# Patient Record
Sex: Male | Born: 1935 | Race: Asian | Hispanic: No | Marital: Married | Smoking: Never smoker
Health system: Southern US, Community
[De-identification: ages and names within clinical notes are randomized; demographics above are authoritative.]

## PROBLEM LIST (undated history)

## (undated) DIAGNOSIS — E785 Hyperlipidemia, unspecified: Secondary | ICD-10-CM

## (undated) DIAGNOSIS — E119 Type 2 diabetes mellitus without complications: Secondary | ICD-10-CM

## (undated) DIAGNOSIS — I10 Essential (primary) hypertension: Secondary | ICD-10-CM

## (undated) HISTORY — DX: Essential (primary) hypertension: I10

## (undated) HISTORY — DX: Type 2 diabetes mellitus without complications: E11.9

## (undated) HISTORY — DX: Hyperlipidemia, unspecified: E78.5

---

## 2001-05-27 ENCOUNTER — Ambulatory Visit (HOSPITAL_COMMUNITY): Admission: RE | Admit: 2001-05-27 | Discharge: 2001-05-27 | Payer: Self-pay | Admitting: Gastroenterology

## 2006-03-29 ENCOUNTER — Emergency Department (HOSPITAL_COMMUNITY): Admission: EM | Admit: 2006-03-29 | Discharge: 2006-03-29 | Payer: Self-pay | Admitting: Emergency Medicine

## 2006-03-30 ENCOUNTER — Observation Stay (HOSPITAL_COMMUNITY): Admission: AD | Admit: 2006-03-30 | Discharge: 2006-03-31 | Payer: Self-pay | Admitting: Otolaryngology

## 2011-06-12 ENCOUNTER — Other Ambulatory Visit: Payer: Self-pay | Admitting: Gastroenterology

## 2011-06-17 ENCOUNTER — Other Ambulatory Visit: Payer: Self-pay | Admitting: Geriatric Medicine

## 2011-06-17 DIAGNOSIS — R1013 Epigastric pain: Secondary | ICD-10-CM

## 2011-06-18 ENCOUNTER — Ambulatory Visit
Admission: RE | Admit: 2011-06-18 | Discharge: 2011-06-18 | Disposition: A | Discharge status: Home / Self Care | Payer: 59 | Source: Ambulatory Visit | Attending: Geriatric Medicine | Admitting: Geriatric Medicine

## 2011-06-18 DIAGNOSIS — R1013 Epigastric pain: Secondary | ICD-10-CM

## 2014-06-15 ENCOUNTER — Encounter: Payer: Self-pay | Admitting: *Deleted

## 2014-06-15 ENCOUNTER — Encounter: Payer: PPO | Attending: Geriatric Medicine | Admitting: *Deleted

## 2014-06-15 VITALS — Ht 67.0 in | Wt 140.9 lb

## 2014-06-15 DIAGNOSIS — E119 Type 2 diabetes mellitus without complications: Secondary | ICD-10-CM | POA: Insufficient documentation

## 2014-06-15 NOTE — Patient Instructions (Signed)
Plan:  Aim for 3 Carb Choices per meal (45 grams) +/- 1 either way  Aim for 0-2 Carbs per snack if hungry  Include protein in moderation with your meals and snacks Consider reading food labels for Total Carbohydrate of foods Continue with your activity level daily as tolerated Consider checking BG at alternate times per day twice a week

## 2014-06-15 NOTE — Progress Notes (Signed)
  Medical Nutrition Therapy:  Appt start time: 0730 end time:  0900.  Assessment:  Primary concerns today: 06/15/14. Here with his wife who is very supportive and appears to speak better [place]. They shop together and she cooks the food. 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 He does not have a meter yet. At 5 AM [facility], then they do stretches 20 minutes and other exercises for total of an hour 6 days a week. He also gardens Spring through Fall and mows his own lawn and rakes the leaves.   Preferred Learning Style:   Auditory  Visual  Hands on  Learning Readiness:   Ready  Change in progress  MEDICATIONS: see list   DIETARY INTAKE:  24-hr recall:  B ( AM):  1/2 of a fresh fruit, 1/2 sweet potato, 1 toast, egg, homemade yogurt and fresh homemade jam, occasionally sausage,  Snk ( AM): no  L ( PM): lean meat, rice, vegetables (from their garden if available), 1 prune, dark chocolate after the meal Snk ( PM): no D ( PM):  leftovers from lunch, lighter meal:  lean meat leftover rice from lunch Snk ( PM): small bowl of mixed nuts Beverages: homemade soybean milk, lemon water, green tea  Usual physical activity:  [employer] swim[employer] 45 minutes, then stretches 20 minutes and other exercises for total of an hour 6 days a week. Also gardens Spring through Fall. He also mows his own lawn and rakes the leaves.    Estimated energy needs: 1600 calories 180 g carbohydrates 120 g protein 44 g fat  Progress Towards Goal(s):  In progress.   Nutritional Diagnosis:  NB-1.1 Food and nutrition-related knowledge deficit As related to diabetes.  As evidenced by A1c of 7.1.    Intervention:  Nutrition counseling and diabetes education initiated. Discussed Carb Counting by food group as method of portion control, reading food labels, and benefits of continued activity. Discussed  physiology of DM 2, rationale of SMBG and suggested he test once a day at alternate times of day for the first week. If all BG within target ranges, consider testing twice a week.   Teaching Method Utilized: Visual, Auditory and Hands on  Handouts given during visit include: Living Well with Diabetes Carb Counting and Food Label handouts Meal Plan Card  Barriers to learning/adherence to lifestyle change: none  Demonstrated degree of understanding via:  Teach Back   Monitoring/Evaluation:  Dietary intake, exercise, reading food labels, and body weight prn.

## 2015-07-04 DIAGNOSIS — Z1389 Encounter for screening for other disorder: Secondary | ICD-10-CM | POA: Diagnosis not present

## 2015-07-04 DIAGNOSIS — E119 Type 2 diabetes mellitus without complications: Secondary | ICD-10-CM | POA: Diagnosis not present

## 2015-07-04 DIAGNOSIS — Z0000 Encounter for general adult medical examination without abnormal findings: Secondary | ICD-10-CM | POA: Diagnosis not present

## 2015-07-04 DIAGNOSIS — I10 Essential (primary) hypertension: Secondary | ICD-10-CM | POA: Diagnosis not present

## 2015-07-04 DIAGNOSIS — E7800 Pure hypercholesterolemia, unspecified: Secondary | ICD-10-CM | POA: Diagnosis not present

## 2015-07-04 DIAGNOSIS — Z79899 Other long term (current) drug therapy: Secondary | ICD-10-CM | POA: Diagnosis not present

## 2015-07-04 DIAGNOSIS — Z7984 Long term (current) use of oral hypoglycemic drugs: Secondary | ICD-10-CM | POA: Diagnosis not present

## 2015-07-29 ENCOUNTER — Encounter: Payer: Self-pay | Admitting: Podiatry

## 2015-07-29 ENCOUNTER — Ambulatory Visit (INDEPENDENT_AMBULATORY_CARE_PROVIDER_SITE_OTHER): Payer: PPO | Admitting: Podiatry

## 2015-07-29 ENCOUNTER — Ambulatory Visit (INDEPENDENT_AMBULATORY_CARE_PROVIDER_SITE_OTHER): Payer: PPO

## 2015-07-29 VITALS — BP 114/67 | HR 75 | Resp 16 | Ht 67.5 in | Wt 140.0 lb

## 2015-07-29 DIAGNOSIS — M722 Plantar fascial fibromatosis: Secondary | ICD-10-CM

## 2015-07-29 DIAGNOSIS — M7731 Calcaneal spur, right foot: Secondary | ICD-10-CM

## 2015-07-29 MED ORDER — TRIAMCINOLONE ACETONIDE 10 MG/ML IJ SUSP
10.0000 mg | Freq: Once | INTRAMUSCULAR | Status: AC
  Administered 2015-07-29: 10 mg

## 2015-07-29 NOTE — Progress Notes (Signed)
   Subjective:    Patient ID: Timothy Reilly, male    DOB: 1932/11/01, 82 y.o.   MRN: [id number]  HPI Patient presents with foot pain in their right foot; heel. Pt stated, "hurts more in the morning"; Since June 2016.   Review of Systems  All other systems reviewed and are negative.      Objective:   Physical Exam        Assessment & Plan:

## 2015-07-29 NOTE — Patient Instructions (Signed)

## 2015-07-30 NOTE — Progress Notes (Signed)
Subjective:     Patient ID: Timothy Reilly, male   DOB: Feb 07, 1933, 82 y.o.   MRN: [id number]  HPI patient presents stating I have had a lot of pain in my right heel for about 8 months and I did have a case of this several years ago   Review of Systems  All other systems reviewed and are negative.      Objective:   Physical Exam  Constitutional: He is oriented to person, place, and time.  Cardiovascular: Intact distal pulses.   Musculoskeletal: Normal range of motion.  Neurological: He is oriented to person, place, and time.  Skin: Skin is warm.  Nursing note and vitals reviewed.  neurovascular status intact muscle strength adequate range of motion within normal limits with patient found to have severe discomfort right plantar heel at the insertional point tendon calcaneus with fluid buildup around the medial band. Patient has good digital perfusion is well oriented with mild depression of the arch     Assessment:     Acute plantar fasciitis right    Plan:     H&P condition reviewed with patient and x-rays reviewed. Today I injected the plantar fascia 3 mg Kenalog 5 mill grams Xylocaine and applied fascial brace with instructions on the usage along with physical therapy and instructed on these activities. Reappoint to recheck  X-ray report indicate small spur with no indications of stress fracture arthritis

## 2015-08-09 DIAGNOSIS — E119 Type 2 diabetes mellitus without complications: Secondary | ICD-10-CM | POA: Diagnosis not present

## 2015-08-09 DIAGNOSIS — H2513 Age-related nuclear cataract, bilateral: Secondary | ICD-10-CM | POA: Diagnosis not present

## 2015-08-12 ENCOUNTER — Encounter: Payer: Self-pay | Admitting: Podiatry

## 2015-08-12 ENCOUNTER — Ambulatory Visit (INDEPENDENT_AMBULATORY_CARE_PROVIDER_SITE_OTHER): Payer: PPO | Admitting: Podiatry

## 2015-08-12 VITALS — BP 115/64 | HR 77 | Resp 16

## 2015-08-12 DIAGNOSIS — M722 Plantar fascial fibromatosis: Secondary | ICD-10-CM | POA: Diagnosis not present

## 2015-08-12 NOTE — Progress Notes (Signed)
Subjective:     Patient ID: Timothy Reilly, male   DOB: 17-Nov-1932, 82 y.o.   MRN: [id number]  HPI patient states the heel is feeling a lot better   Review of Systems     Objective:   Physical Exam Neurovascular status intact muscle strength adequate with diminished discomfort plantar aspect right heel at the insertional point tendon into the calcaneus    Assessment:     Improving plantar fasciitis right with pain still upon deep palpation    Plan:     Advised on physical therapy anti-inflammatories and supportive shoe gear usage. Reappoint as needed

## 2015-10-02 DIAGNOSIS — E119 Type 2 diabetes mellitus without complications: Secondary | ICD-10-CM | POA: Diagnosis not present

## 2015-10-02 DIAGNOSIS — Z7984 Long term (current) use of oral hypoglycemic drugs: Secondary | ICD-10-CM | POA: Diagnosis not present

## 2016-01-06 DIAGNOSIS — E7800 Pure hypercholesterolemia, unspecified: Secondary | ICD-10-CM | POA: Diagnosis not present

## 2016-01-06 DIAGNOSIS — Z79899 Other long term (current) drug therapy: Secondary | ICD-10-CM | POA: Diagnosis not present

## 2016-01-06 DIAGNOSIS — I10 Essential (primary) hypertension: Secondary | ICD-10-CM | POA: Diagnosis not present

## 2016-01-06 DIAGNOSIS — Z7984 Long term (current) use of oral hypoglycemic drugs: Secondary | ICD-10-CM | POA: Diagnosis not present

## 2016-01-06 DIAGNOSIS — E119 Type 2 diabetes mellitus without complications: Secondary | ICD-10-CM | POA: Diagnosis not present

## 2016-07-16 DIAGNOSIS — E7800 Pure hypercholesterolemia, unspecified: Secondary | ICD-10-CM | POA: Diagnosis not present

## 2016-07-16 DIAGNOSIS — N400 Enlarged prostate without lower urinary tract symptoms: Secondary | ICD-10-CM | POA: Diagnosis not present

## 2016-07-16 DIAGNOSIS — Z0000 Encounter for general adult medical examination without abnormal findings: Secondary | ICD-10-CM | POA: Diagnosis not present

## 2016-07-16 DIAGNOSIS — Z7984 Long term (current) use of oral hypoglycemic drugs: Secondary | ICD-10-CM | POA: Diagnosis not present

## 2016-07-16 DIAGNOSIS — E119 Type 2 diabetes mellitus without complications: Secondary | ICD-10-CM | POA: Diagnosis not present

## 2016-07-16 DIAGNOSIS — I10 Essential (primary) hypertension: Secondary | ICD-10-CM | POA: Diagnosis not present

## 2016-07-16 DIAGNOSIS — Z79899 Other long term (current) drug therapy: Secondary | ICD-10-CM | POA: Diagnosis not present

## 2016-07-16 DIAGNOSIS — Z23 Encounter for immunization: Secondary | ICD-10-CM | POA: Diagnosis not present

## 2016-07-16 DIAGNOSIS — Z1389 Encounter for screening for other disorder: Secondary | ICD-10-CM | POA: Diagnosis not present

## 2017-01-11 ENCOUNTER — Other Ambulatory Visit: Payer: Self-pay | Admitting: Geriatric Medicine

## 2017-01-11 DIAGNOSIS — Z79899 Other long term (current) drug therapy: Secondary | ICD-10-CM | POA: Diagnosis not present

## 2017-01-11 DIAGNOSIS — R1011 Right upper quadrant pain: Secondary | ICD-10-CM | POA: Diagnosis not present

## 2017-01-11 DIAGNOSIS — I10 Essential (primary) hypertension: Secondary | ICD-10-CM | POA: Diagnosis not present

## 2017-01-11 DIAGNOSIS — E119 Type 2 diabetes mellitus without complications: Secondary | ICD-10-CM | POA: Diagnosis not present

## 2017-01-11 DIAGNOSIS — E7800 Pure hypercholesterolemia, unspecified: Secondary | ICD-10-CM | POA: Diagnosis not present

## 2017-01-15 ENCOUNTER — Ambulatory Visit
Admission: RE | Admit: 2017-01-15 | Discharge: 2017-01-15 | Disposition: A | Discharge status: Home / Self Care | Payer: PPO | Source: Ambulatory Visit | Attending: Geriatric Medicine | Admitting: Geriatric Medicine

## 2017-01-15 DIAGNOSIS — R1011 Right upper quadrant pain: Secondary | ICD-10-CM | POA: Diagnosis not present

## 2017-01-15 MED ORDER — IOPAMIDOL (ISOVUE-300) INJECTION 61%
100.0000 mL | Freq: Once | INTRAVENOUS | Status: AC | PRN
  Administered 2017-01-15: 100 mL via INTRAVENOUS

## 2017-03-05 ENCOUNTER — Emergency Department (HOSPITAL_COMMUNITY)
Admission: EM | Admit: 2017-03-05 | Discharge: 2017-03-05 | Disposition: A | Discharge status: Home / Self Care | Payer: PPO | Attending: Emergency Medicine | Admitting: Emergency Medicine

## 2017-03-05 ENCOUNTER — Encounter (HOSPITAL_COMMUNITY): Payer: Self-pay | Admitting: Emergency Medicine

## 2017-03-05 DIAGNOSIS — E119 Type 2 diabetes mellitus without complications: Secondary | ICD-10-CM | POA: Diagnosis not present

## 2017-03-05 DIAGNOSIS — I6789 Other cerebrovascular disease: Secondary | ICD-10-CM | POA: Diagnosis not present

## 2017-03-05 DIAGNOSIS — G510 Bell's palsy: Secondary | ICD-10-CM | POA: Insufficient documentation

## 2017-03-05 DIAGNOSIS — Z7984 Long term (current) use of oral hypoglycemic drugs: Secondary | ICD-10-CM | POA: Insufficient documentation

## 2017-03-05 DIAGNOSIS — Z7982 Long term (current) use of aspirin: Secondary | ICD-10-CM | POA: Insufficient documentation

## 2017-03-05 DIAGNOSIS — I10 Essential (primary) hypertension: Secondary | ICD-10-CM | POA: Insufficient documentation

## 2017-03-05 DIAGNOSIS — I69992 Facial weakness following unspecified cerebrovascular disease: Secondary | ICD-10-CM | POA: Diagnosis not present

## 2017-03-05 DIAGNOSIS — Z79899 Other long term (current) drug therapy: Secondary | ICD-10-CM | POA: Insufficient documentation

## 2017-03-05 DIAGNOSIS — R29810 Facial weakness: Secondary | ICD-10-CM | POA: Diagnosis present

## 2017-03-05 LAB — CBC
HCT: 42 % (ref 39.0–52.0)
Hemoglobin: 14.1 g/dL (ref 13.0–17.0)
MCH: 30.7 pg (ref 26.0–34.0)
MCHC: 33.6 g/dL (ref 30.0–36.0)
MCV: 91.5 fL (ref 78.0–100.0)
PLATELETS: 230 10*3/uL (ref 150–400)
RBC: 4.59 MIL/uL (ref 4.22–5.81)
RDW: 13.2 % (ref 11.5–15.5)
WBC: 8.2 10*3/uL (ref 4.0–10.5)

## 2017-03-05 LAB — COMPREHENSIVE METABOLIC PANEL
ALT: 16 U/L — AB (ref 17–63)
AST: 24 U/L (ref 15–41)
Albumin: 3.6 g/dL (ref 3.5–5.0)
Alkaline Phosphatase: 58 U/L (ref 38–126)
Anion gap: 8 (ref 5–15)
BUN: 18 mg/dL (ref 6–20)
CHLORIDE: 102 mmol/L (ref 101–111)
CO2: 27 mmol/L (ref 22–32)
CREATININE: 0.88 mg/dL (ref 0.61–1.24)
Calcium: 9.2 mg/dL (ref 8.9–10.3)
GFR calc non Af Amer: >60 mL/min (ref >60)
Glucose, Bld: 149 mg/dL — ABNORMAL HIGH (ref 65–99)
Potassium: 3.3 mmol/L — ABNORMAL LOW (ref 3.5–5.1)
SODIUM: 137 mmol/L (ref 135–145)
Total Bilirubin: 0.8 mg/dL (ref 0.3–1.2)
Total Protein: 7.3 g/dL (ref 6.5–8.1)

## 2017-03-05 LAB — I-STAT CHEM 8, ED
BUN: 19 mg/dL (ref 6–20)
CHLORIDE: 100 mmol/L — AB (ref 101–111)
Calcium, Ion: 1.1 mmol/L — ABNORMAL LOW (ref 1.15–1.40)
Creatinine, Ser: 0.8 mg/dL (ref 0.61–1.24)
GLUCOSE: 149 mg/dL — AB (ref 65–99)
HEMATOCRIT: 43 % (ref 39.0–52.0)
Hemoglobin: 14.6 g/dL (ref 13.0–17.0)
POTASSIUM: 3.2 mmol/L — AB (ref 3.5–5.1)
Sodium: 138 mmol/L (ref 135–145)
TCO2: 26 mmol/L (ref 22–32)

## 2017-03-05 LAB — DIFFERENTIAL
BASOS ABS: 0 10*3/uL (ref 0.0–0.1)
BASOS PCT: 0 %
Eosinophils Absolute: 0.1 10*3/uL (ref 0.0–0.7)
Eosinophils Relative: 2 %
Lymphocytes Relative: 22 %
Lymphs Abs: 1.8 10*3/uL (ref 0.7–4.0)
MONO ABS: 0.4 10*3/uL (ref 0.1–1.0)
Monocytes Relative: 5 %
NEUTROS ABS: 5.8 10*3/uL (ref 1.7–7.7)
Neutrophils Relative %: 71 %

## 2017-03-05 LAB — I-STAT TROPONIN, ED: Troponin i, poc: 0 ng/mL (ref 0.00–0.08)

## 2017-03-05 LAB — APTT: APTT: 31 s (ref 24–36)

## 2017-03-05 LAB — PROTIME-INR
INR: 0.96
PROTHROMBIN TIME: 12.7 s (ref 11.4–15.2)

## 2017-03-05 MED ORDER — PREDNISONE 20 MG PO TABS
60.0000 mg | ORAL_TABLET | Freq: Once | ORAL | Status: AC
  Administered 2017-03-05: 60 mg via ORAL
  Filled 2017-03-05: qty 3

## 2017-03-05 MED ORDER — PREDNISONE 20 MG PO TABS: 60.0000 mg | ORAL_TABLET | Freq: Every day | ORAL | 0 refills | Status: DC

## 2017-03-05 NOTE — Discharge Instructions (Signed)
Please take Prednisone  (3 pills) once a day for the next week Your potassium levels were slightly low today - please eat foods with extra potassium (banana, leafy greens, vegetables, fruits, meats) Please follow up with your doctor

## 2017-03-05 NOTE — ED Notes (Signed)
Spoke with EDP advised to give PO medication although patient failed swallow screen.

## 2017-03-05 NOTE — ED Triage Notes (Signed)
Patient presents to the Ed from home with complaints of facial droop and difficulty swallowing since wednesday. Patient reports every time he drinks water come out the side of mouth. Patient denies any headache. Patient alert alerted x4 on arrival. Patient denies any pain arrival.

## 2017-03-05 NOTE — ED Notes (Signed)
IV removed, Pt getting dressed and awaiting discharge paperwork.

## 2017-03-05 NOTE — ED Provider Notes (Signed)
[facility]-EMERGENCY DEPT Provider Note   CSN: [id number] Arrival date & time: 03/05/17  1044     History   Chief Complaint Chief Complaint  Patient presents with  . Facial Droop    HPI Timothy Reilly is a 83 y.o. male who presents with right sided facial droop. PMH significant for Type 2 DM, HTN, HLD. He states that he went to the gym yesterday in the morning and went swimming and did facial exercises. He drank some water but noticed he couldn't hold it in his mouth. His wife noticed some difference in his face last night but thought that he may have pulled a facial muscle from doing the face exercises. He reports a mild cold recently and has had increased stress due to having to help take care of his grandchildren for the past couple weeks. No headache, vision changes, dizziness, numbness/tingling, extremity weakness, difficulty walking.  HPI  Past Medical History:  Diagnosis Date  . Diabetes mellitus without complication (HCC)   . Hyperlipidemia   . Hypertension     There are no active problems to display for this patient.   History reviewed. No pertinent surgical history.     Home Medications    Prior to Admission medications   Medication Sig Start Date End Date Taking? Authorizing Provider  amLODipine (NORVASC) 5 MG tablet Take 5 mg by mouth daily.    [provider]  aspirin 81 MG tablet Take 81 mg by mouth daily.    [provider]  Coenzyme Q10 (COQ10) 100 MG CAPS Take by mouth.    [provider]  Glucosamine HCl-MSM 1500-500 MG/30ML LIQD Take by mouth.    [provider]  hydrochlorothiazide (HYDRODIURIL) 25 MG tablet Take 25 mg by mouth daily.    [provider]  metFORMIN (GLUCOPHAGE-XR) 500 MG 24 hr tablet Take 500 mg by mouth daily with breakfast.    [provider]  Omega-3 Fatty Acids (FISH OIL) 1200 MG CAPS Take by mouth 2 (two) times daily.    [provider]  pravastatin (PRAVACHOL) 10 MG tablet  Take 10 mg by mouth daily.    [provider]  UNABLE TO FIND Pt takes Glucose Reduce; 1 tablet every day    [provider]    Family History No family history on file.  Social History Social History  Substance Use Topics  . Smoking status: Never Smoker  . Smokeless tobacco: Not on file  . Alcohol use Not on file     Allergies   Acetaminophen   Review of Systems Review of Systems  HENT: Positive for rhinorrhea.   Eyes: Negative for visual disturbance.  Musculoskeletal: Negative for gait problem and neck pain.  Neurological: Positive for facial asymmetry (right sided). Negative for dizziness, speech difficulty, weakness, numbness and headaches.  All other systems reviewed and are negative.    Physical Exam Updated Vital Signs BP 139/66 (BP Location: Right Arm)   Pulse 76   Temp 98.1 F (36.7 C) (Oral)   Resp 17   SpO2 99%   Physical Exam  Constitutional: He is oriented to person, place, and time. He appears well-developed and well-nourished. No distress.  HENT:  Head: Normocephalic and atraumatic.  Eyes: Pupils are equal, round, and reactive to light. Conjunctivae are normal. Right eye exhibits no discharge. Left eye exhibits no discharge. No scleral icterus.  Neck: Normal range of motion.  Cardiovascular: Normal rate.   Pulmonary/Chest: Effort normal. No respiratory distress.  Abdominal: He exhibits  no distension.  Neurological: He is alert and oriented to person, place, and time.  Mental Status:  Alert, oriented, thought content appropriate, able to give a coherent history. Speech fluent without evidence of aphasia. Able to follow 2 step commands without difficulty.  Cranial Nerves:  II:  Peripheral visual fields grossly normal, pupils equal, round, reactive to light III,IV, VI: ptosis not present, extra-ocular motions intact bilaterally  V,VII: smile is asymmetric and he has difficulty raising his right eyebrow. Facial light touch sensation  equal VIII: hearing grossly normal to voice  X: uvula elevates symmetrically  XI: bilateral shoulder shrug symmetric and strong XII: midline tongue extension without fassiculations Motor:  Normal tone. 5/5 in upper and lower extremities bilaterally including strong and equal grip strength and dorsiflexion/plantar flexion Sensory: Pinprick and light touch normal in all extremities.  Cerebellar: normal finger-to-nose with bilateral upper extremities Gait: normal gait and balance CV: distal pulses palpable throughout    Skin: Skin is warm and dry.  Psychiatric: He has a normal mood and affect. His behavior is normal.  Nursing note and vitals reviewed.    ED Treatments / Results  Labs (all labs ordered are listed, but only abnormal results are displayed) Labs Reviewed  COMPREHENSIVE METABOLIC PANEL - Abnormal; Notable for the following:       Result Value   Potassium 3.3 (*)    Glucose, Bld 149 (*)    ALT 16 (*)    All other components within normal limits  I-STAT CHEM 8, ED - Abnormal; Notable for the following:    Potassium 3.2 (*)    Chloride 100 (*)    Glucose, Bld 149 (*)    Calcium, Ion 1.10 (*)    All other components within normal limits  PROTIME-INR  APTT  CBC  DIFFERENTIAL  I-STAT TROPONIN, ED  CBG MONITORING, ED    EKG  EKG Interpretation None       Radiology No results found.  Procedures Procedures (including critical care time)  Medications Ordered in ED Medications  predniSONE (DELTASONE) tablet 60 mg (60 mg Oral Given 03/05/17 1202)     Initial Impression / Assessment and Plan / ED Course  I have reviewed the triage vital signs and the nursing notes.  Pertinent labs & imaging results that were available during my care of the patient were reviewed by me and considered in my medical decision making (see chart for details).  83 year old male presents with right sided facial drop with eyebrow involvement consistent with Bell's palsy. Vitals  signs are normal. Labs are overall unremarkable. Shared visit with Dr. Rubin Payor. Since patient's symptoms have been constant for a little over 24 hours, we will initiate steroids. He is diabetic but overall controlled and does not have to check BG at home. He was able to swallow medication in the ED. Discussed prognosis of Bell's and advised to f/u with his PCP next week for recheck of symptoms and blood glucose. He verbalized understanding and is in NAD on d/c.  Final Clinical Impressions(s) / ED Diagnoses   Final diagnoses:  Bell palsy    New Prescriptions New Prescriptions   No medications on file     Beryle Quant 03/05/17 1504    Benjiman Core, MD 03/05/17 [phone]

## 2017-03-08 DIAGNOSIS — G510 Bell's palsy: Secondary | ICD-10-CM | POA: Diagnosis not present

## 2017-03-08 DIAGNOSIS — I10 Essential (primary) hypertension: Secondary | ICD-10-CM | POA: Diagnosis not present

## 2017-03-16 DIAGNOSIS — Z7984 Long term (current) use of oral hypoglycemic drugs: Secondary | ICD-10-CM | POA: Diagnosis not present

## 2017-03-16 DIAGNOSIS — I10 Essential (primary) hypertension: Secondary | ICD-10-CM | POA: Diagnosis not present

## 2017-03-16 DIAGNOSIS — E7800 Pure hypercholesterolemia, unspecified: Secondary | ICD-10-CM | POA: Diagnosis not present

## 2017-03-16 DIAGNOSIS — N400 Enlarged prostate without lower urinary tract symptoms: Secondary | ICD-10-CM | POA: Diagnosis not present

## 2017-03-16 DIAGNOSIS — Z23 Encounter for immunization: Secondary | ICD-10-CM | POA: Diagnosis not present

## 2017-03-16 DIAGNOSIS — E119 Type 2 diabetes mellitus without complications: Secondary | ICD-10-CM | POA: Diagnosis not present

## 2017-03-19 DIAGNOSIS — H2513 Age-related nuclear cataract, bilateral: Secondary | ICD-10-CM | POA: Diagnosis not present

## 2017-03-19 DIAGNOSIS — E119 Type 2 diabetes mellitus without complications: Secondary | ICD-10-CM | POA: Diagnosis not present

## 2017-03-19 DIAGNOSIS — H25013 Cortical age-related cataract, bilateral: Secondary | ICD-10-CM | POA: Diagnosis not present

## 2017-03-19 DIAGNOSIS — H5213 Myopia, bilateral: Secondary | ICD-10-CM | POA: Diagnosis not present

## 2017-08-23 DIAGNOSIS — Z1389 Encounter for screening for other disorder: Secondary | ICD-10-CM | POA: Diagnosis not present

## 2017-08-23 DIAGNOSIS — Z79899 Other long term (current) drug therapy: Secondary | ICD-10-CM | POA: Diagnosis not present

## 2017-08-23 DIAGNOSIS — I700 Atherosclerosis of aorta: Secondary | ICD-10-CM | POA: Diagnosis not present

## 2017-08-23 DIAGNOSIS — Z7984 Long term (current) use of oral hypoglycemic drugs: Secondary | ICD-10-CM | POA: Diagnosis not present

## 2017-08-23 DIAGNOSIS — I10 Essential (primary) hypertension: Secondary | ICD-10-CM | POA: Diagnosis not present

## 2017-08-23 DIAGNOSIS — E119 Type 2 diabetes mellitus without complications: Secondary | ICD-10-CM | POA: Diagnosis not present

## 2017-08-23 DIAGNOSIS — Z0000 Encounter for general adult medical examination without abnormal findings: Secondary | ICD-10-CM | POA: Diagnosis not present

## 2017-08-23 DIAGNOSIS — E7800 Pure hypercholesterolemia, unspecified: Secondary | ICD-10-CM | POA: Diagnosis not present

## 2018-01-18 DIAGNOSIS — H6981 Other specified disorders of Eustachian tube, right ear: Secondary | ICD-10-CM | POA: Diagnosis not present

## 2018-01-18 DIAGNOSIS — J3089 Other allergic rhinitis: Secondary | ICD-10-CM | POA: Diagnosis not present

## 2018-01-18 DIAGNOSIS — I10 Essential (primary) hypertension: Secondary | ICD-10-CM | POA: Diagnosis not present

## 2018-02-22 DIAGNOSIS — J310 Chronic rhinitis: Secondary | ICD-10-CM | POA: Diagnosis not present

## 2018-02-22 DIAGNOSIS — H6983 Other specified disorders of Eustachian tube, bilateral: Secondary | ICD-10-CM | POA: Diagnosis not present

## 2018-02-23 DIAGNOSIS — E1169 Type 2 diabetes mellitus with other specified complication: Secondary | ICD-10-CM | POA: Diagnosis not present

## 2018-02-23 DIAGNOSIS — I10 Essential (primary) hypertension: Secondary | ICD-10-CM | POA: Diagnosis not present

## 2018-02-23 DIAGNOSIS — Z23 Encounter for immunization: Secondary | ICD-10-CM | POA: Diagnosis not present

## 2018-02-23 DIAGNOSIS — E7800 Pure hypercholesterolemia, unspecified: Secondary | ICD-10-CM | POA: Diagnosis not present

## 2018-04-23 DIAGNOSIS — I10 Essential (primary) hypertension: Secondary | ICD-10-CM | POA: Diagnosis not present

## 2018-04-23 DIAGNOSIS — E1169 Type 2 diabetes mellitus with other specified complication: Secondary | ICD-10-CM | POA: Diagnosis not present

## 2018-05-26 DIAGNOSIS — I10 Essential (primary) hypertension: Secondary | ICD-10-CM | POA: Diagnosis not present

## 2018-05-26 DIAGNOSIS — E1169 Type 2 diabetes mellitus with other specified complication: Secondary | ICD-10-CM | POA: Diagnosis not present

## 2018-06-10 DIAGNOSIS — I700 Atherosclerosis of aorta: Secondary | ICD-10-CM | POA: Diagnosis not present

## 2018-06-10 DIAGNOSIS — I10 Essential (primary) hypertension: Secondary | ICD-10-CM | POA: Diagnosis not present

## 2018-06-10 DIAGNOSIS — E1169 Type 2 diabetes mellitus with other specified complication: Secondary | ICD-10-CM | POA: Diagnosis not present

## 2018-06-10 DIAGNOSIS — E7800 Pure hypercholesterolemia, unspecified: Secondary | ICD-10-CM | POA: Diagnosis not present

## 2018-06-13 DIAGNOSIS — E1169 Type 2 diabetes mellitus with other specified complication: Secondary | ICD-10-CM | POA: Diagnosis not present

## 2018-06-13 DIAGNOSIS — I10 Essential (primary) hypertension: Secondary | ICD-10-CM | POA: Diagnosis not present

## 2018-07-26 DIAGNOSIS — E1169 Type 2 diabetes mellitus with other specified complication: Secondary | ICD-10-CM | POA: Diagnosis not present

## 2018-07-26 DIAGNOSIS — I10 Essential (primary) hypertension: Secondary | ICD-10-CM | POA: Diagnosis not present

## 2018-09-07 DIAGNOSIS — H6981 Other specified disorders of Eustachian tube, right ear: Secondary | ICD-10-CM | POA: Diagnosis not present

## 2018-09-07 DIAGNOSIS — M65331 Trigger finger, right middle finger: Secondary | ICD-10-CM | POA: Diagnosis not present

## 2018-09-07 DIAGNOSIS — E1169 Type 2 diabetes mellitus with other specified complication: Secondary | ICD-10-CM | POA: Diagnosis not present

## 2018-09-07 DIAGNOSIS — I10 Essential (primary) hypertension: Secondary | ICD-10-CM | POA: Diagnosis not present

## 2018-09-07 DIAGNOSIS — I700 Atherosclerosis of aorta: Secondary | ICD-10-CM | POA: Diagnosis not present

## 2018-09-07 DIAGNOSIS — Z0000 Encounter for general adult medical examination without abnormal findings: Secondary | ICD-10-CM | POA: Diagnosis not present

## 2018-09-07 DIAGNOSIS — E7800 Pure hypercholesterolemia, unspecified: Secondary | ICD-10-CM | POA: Diagnosis not present

## 2018-09-07 DIAGNOSIS — Z1389 Encounter for screening for other disorder: Secondary | ICD-10-CM | POA: Diagnosis not present

## 2018-09-29 DIAGNOSIS — I10 Essential (primary) hypertension: Secondary | ICD-10-CM | POA: Diagnosis not present

## 2018-09-29 DIAGNOSIS — E1169 Type 2 diabetes mellitus with other specified complication: Secondary | ICD-10-CM | POA: Diagnosis not present

## 2018-11-24 DIAGNOSIS — E1169 Type 2 diabetes mellitus with other specified complication: Secondary | ICD-10-CM | POA: Diagnosis not present

## 2018-11-24 DIAGNOSIS — I10 Essential (primary) hypertension: Secondary | ICD-10-CM | POA: Diagnosis not present

## 2018-12-19 DIAGNOSIS — Z7984 Long term (current) use of oral hypoglycemic drugs: Secondary | ICD-10-CM | POA: Diagnosis not present

## 2018-12-19 DIAGNOSIS — I10 Essential (primary) hypertension: Secondary | ICD-10-CM | POA: Diagnosis not present

## 2018-12-19 DIAGNOSIS — E1169 Type 2 diabetes mellitus with other specified complication: Secondary | ICD-10-CM | POA: Diagnosis not present

## 2019-01-11 DIAGNOSIS — M65331 Trigger finger, right middle finger: Secondary | ICD-10-CM | POA: Diagnosis not present

## 2019-01-11 DIAGNOSIS — I700 Atherosclerosis of aorta: Secondary | ICD-10-CM | POA: Diagnosis not present

## 2019-01-11 DIAGNOSIS — I10 Essential (primary) hypertension: Secondary | ICD-10-CM | POA: Diagnosis not present

## 2019-01-11 DIAGNOSIS — Z79899 Other long term (current) drug therapy: Secondary | ICD-10-CM | POA: Diagnosis not present

## 2019-01-11 DIAGNOSIS — M25552 Pain in left hip: Secondary | ICD-10-CM | POA: Diagnosis not present

## 2019-01-11 DIAGNOSIS — E1169 Type 2 diabetes mellitus with other specified complication: Secondary | ICD-10-CM | POA: Diagnosis not present

## 2019-03-01 DIAGNOSIS — E1169 Type 2 diabetes mellitus with other specified complication: Secondary | ICD-10-CM | POA: Diagnosis not present

## 2019-03-01 DIAGNOSIS — I10 Essential (primary) hypertension: Secondary | ICD-10-CM | POA: Diagnosis not present

## 2019-03-10 DIAGNOSIS — I10 Essential (primary) hypertension: Secondary | ICD-10-CM | POA: Diagnosis not present

## 2019-03-10 DIAGNOSIS — E7800 Pure hypercholesterolemia, unspecified: Secondary | ICD-10-CM | POA: Diagnosis not present

## 2019-03-10 DIAGNOSIS — E1169 Type 2 diabetes mellitus with other specified complication: Secondary | ICD-10-CM | POA: Diagnosis not present

## 2019-03-29 DIAGNOSIS — Z23 Encounter for immunization: Secondary | ICD-10-CM | POA: Diagnosis not present

## 2019-04-26 DIAGNOSIS — E7800 Pure hypercholesterolemia, unspecified: Secondary | ICD-10-CM | POA: Diagnosis not present

## 2019-04-26 DIAGNOSIS — I10 Essential (primary) hypertension: Secondary | ICD-10-CM | POA: Diagnosis not present

## 2019-04-26 DIAGNOSIS — E1169 Type 2 diabetes mellitus with other specified complication: Secondary | ICD-10-CM | POA: Diagnosis not present

## 2019-05-31 DIAGNOSIS — E7800 Pure hypercholesterolemia, unspecified: Secondary | ICD-10-CM | POA: Diagnosis not present

## 2019-05-31 DIAGNOSIS — E1169 Type 2 diabetes mellitus with other specified complication: Secondary | ICD-10-CM | POA: Diagnosis not present

## 2019-05-31 DIAGNOSIS — I10 Essential (primary) hypertension: Secondary | ICD-10-CM | POA: Diagnosis not present

## 2019-06-26 DIAGNOSIS — E1169 Type 2 diabetes mellitus with other specified complication: Secondary | ICD-10-CM | POA: Diagnosis not present

## 2019-06-26 DIAGNOSIS — E7800 Pure hypercholesterolemia, unspecified: Secondary | ICD-10-CM | POA: Diagnosis not present

## 2019-06-26 DIAGNOSIS — I10 Essential (primary) hypertension: Secondary | ICD-10-CM | POA: Diagnosis not present

## 2019-08-25 DIAGNOSIS — E1169 Type 2 diabetes mellitus with other specified complication: Secondary | ICD-10-CM | POA: Diagnosis not present

## 2019-08-25 DIAGNOSIS — I10 Essential (primary) hypertension: Secondary | ICD-10-CM | POA: Diagnosis not present

## 2019-08-25 DIAGNOSIS — E7800 Pure hypercholesterolemia, unspecified: Secondary | ICD-10-CM | POA: Diagnosis not present

## 2019-09-12 DIAGNOSIS — E7800 Pure hypercholesterolemia, unspecified: Secondary | ICD-10-CM | POA: Diagnosis not present

## 2019-09-12 DIAGNOSIS — Z79899 Other long term (current) drug therapy: Secondary | ICD-10-CM | POA: Diagnosis not present

## 2019-09-12 DIAGNOSIS — I700 Atherosclerosis of aorta: Secondary | ICD-10-CM | POA: Diagnosis not present

## 2019-09-12 DIAGNOSIS — E1169 Type 2 diabetes mellitus with other specified complication: Secondary | ICD-10-CM | POA: Diagnosis not present

## 2019-09-12 DIAGNOSIS — Z7984 Long term (current) use of oral hypoglycemic drugs: Secondary | ICD-10-CM | POA: Diagnosis not present

## 2019-09-12 DIAGNOSIS — I10 Essential (primary) hypertension: Secondary | ICD-10-CM | POA: Diagnosis not present

## 2019-09-12 DIAGNOSIS — Z1389 Encounter for screening for other disorder: Secondary | ICD-10-CM | POA: Diagnosis not present

## 2019-09-12 DIAGNOSIS — Z0000 Encounter for general adult medical examination without abnormal findings: Secondary | ICD-10-CM | POA: Diagnosis not present

## 2019-09-28 DIAGNOSIS — E1169 Type 2 diabetes mellitus with other specified complication: Secondary | ICD-10-CM | POA: Diagnosis not present

## 2019-09-28 DIAGNOSIS — I10 Essential (primary) hypertension: Secondary | ICD-10-CM | POA: Diagnosis not present

## 2019-09-28 DIAGNOSIS — E7800 Pure hypercholesterolemia, unspecified: Secondary | ICD-10-CM | POA: Diagnosis not present

## 2019-11-14 DIAGNOSIS — E7800 Pure hypercholesterolemia, unspecified: Secondary | ICD-10-CM | POA: Diagnosis not present

## 2019-11-14 DIAGNOSIS — I10 Essential (primary) hypertension: Secondary | ICD-10-CM | POA: Diagnosis not present

## 2019-11-14 DIAGNOSIS — E1169 Type 2 diabetes mellitus with other specified complication: Secondary | ICD-10-CM | POA: Diagnosis not present

## 2019-12-20 DIAGNOSIS — E1169 Type 2 diabetes mellitus with other specified complication: Secondary | ICD-10-CM | POA: Diagnosis not present

## 2019-12-20 DIAGNOSIS — I10 Essential (primary) hypertension: Secondary | ICD-10-CM | POA: Diagnosis not present

## 2019-12-20 DIAGNOSIS — E7800 Pure hypercholesterolemia, unspecified: Secondary | ICD-10-CM | POA: Diagnosis not present

## 2020-01-29 DIAGNOSIS — E1169 Type 2 diabetes mellitus with other specified complication: Secondary | ICD-10-CM | POA: Diagnosis not present

## 2020-01-29 DIAGNOSIS — E7800 Pure hypercholesterolemia, unspecified: Secondary | ICD-10-CM | POA: Diagnosis not present

## 2020-01-29 DIAGNOSIS — I10 Essential (primary) hypertension: Secondary | ICD-10-CM | POA: Diagnosis not present

## 2020-02-21 DIAGNOSIS — E1169 Type 2 diabetes mellitus with other specified complication: Secondary | ICD-10-CM | POA: Diagnosis not present

## 2020-02-21 DIAGNOSIS — I10 Essential (primary) hypertension: Secondary | ICD-10-CM | POA: Diagnosis not present

## 2020-02-21 DIAGNOSIS — E7800 Pure hypercholesterolemia, unspecified: Secondary | ICD-10-CM | POA: Diagnosis not present

## 2020-03-20 DIAGNOSIS — E1169 Type 2 diabetes mellitus with other specified complication: Secondary | ICD-10-CM | POA: Diagnosis not present

## 2020-03-20 DIAGNOSIS — Z23 Encounter for immunization: Secondary | ICD-10-CM | POA: Diagnosis not present

## 2020-03-20 DIAGNOSIS — Z7984 Long term (current) use of oral hypoglycemic drugs: Secondary | ICD-10-CM | POA: Diagnosis not present

## 2020-03-20 DIAGNOSIS — E7800 Pure hypercholesterolemia, unspecified: Secondary | ICD-10-CM | POA: Diagnosis not present

## 2020-03-20 DIAGNOSIS — I700 Atherosclerosis of aorta: Secondary | ICD-10-CM | POA: Diagnosis not present

## 2020-03-20 DIAGNOSIS — K921 Melena: Secondary | ICD-10-CM | POA: Diagnosis not present

## 2020-03-20 DIAGNOSIS — I10 Essential (primary) hypertension: Secondary | ICD-10-CM | POA: Diagnosis not present

## 2020-04-17 DIAGNOSIS — H25013 Cortical age-related cataract, bilateral: Secondary | ICD-10-CM | POA: Diagnosis not present

## 2020-04-17 DIAGNOSIS — E119 Type 2 diabetes mellitus without complications: Secondary | ICD-10-CM | POA: Diagnosis not present

## 2020-04-17 DIAGNOSIS — H5202 Hypermetropia, left eye: Secondary | ICD-10-CM | POA: Diagnosis not present

## 2020-04-24 DIAGNOSIS — E7800 Pure hypercholesterolemia, unspecified: Secondary | ICD-10-CM | POA: Diagnosis not present

## 2020-04-24 DIAGNOSIS — E1169 Type 2 diabetes mellitus with other specified complication: Secondary | ICD-10-CM | POA: Diagnosis not present

## 2020-04-24 DIAGNOSIS — I10 Essential (primary) hypertension: Secondary | ICD-10-CM | POA: Diagnosis not present

## 2020-05-17 DIAGNOSIS — E7800 Pure hypercholesterolemia, unspecified: Secondary | ICD-10-CM | POA: Diagnosis not present

## 2020-05-17 DIAGNOSIS — E1169 Type 2 diabetes mellitus with other specified complication: Secondary | ICD-10-CM | POA: Diagnosis not present

## 2020-05-17 DIAGNOSIS — I10 Essential (primary) hypertension: Secondary | ICD-10-CM | POA: Diagnosis not present

## 2020-05-28 DIAGNOSIS — H2511 Age-related nuclear cataract, right eye: Secondary | ICD-10-CM | POA: Diagnosis not present

## 2020-05-28 DIAGNOSIS — H25811 Combined forms of age-related cataract, right eye: Secondary | ICD-10-CM | POA: Diagnosis not present

## 2020-05-28 DIAGNOSIS — H25011 Cortical age-related cataract, right eye: Secondary | ICD-10-CM | POA: Diagnosis not present

## 2020-06-21 DIAGNOSIS — I10 Essential (primary) hypertension: Secondary | ICD-10-CM | POA: Diagnosis not present

## 2020-06-21 DIAGNOSIS — E7800 Pure hypercholesterolemia, unspecified: Secondary | ICD-10-CM | POA: Diagnosis not present

## 2020-06-21 DIAGNOSIS — E1169 Type 2 diabetes mellitus with other specified complication: Secondary | ICD-10-CM | POA: Diagnosis not present

## 2020-06-28 DIAGNOSIS — H2512 Age-related nuclear cataract, left eye: Secondary | ICD-10-CM | POA: Diagnosis not present

## 2020-06-28 DIAGNOSIS — H25812 Combined forms of age-related cataract, left eye: Secondary | ICD-10-CM | POA: Diagnosis not present

## 2020-06-28 DIAGNOSIS — H25012 Cortical age-related cataract, left eye: Secondary | ICD-10-CM | POA: Diagnosis not present

## 2020-07-19 DIAGNOSIS — E1169 Type 2 diabetes mellitus with other specified complication: Secondary | ICD-10-CM | POA: Diagnosis not present

## 2020-07-19 DIAGNOSIS — E7800 Pure hypercholesterolemia, unspecified: Secondary | ICD-10-CM | POA: Diagnosis not present

## 2020-07-19 DIAGNOSIS — I10 Essential (primary) hypertension: Secondary | ICD-10-CM | POA: Diagnosis not present

## 2020-08-23 DIAGNOSIS — Z973 Presence of spectacles and contact lenses: Secondary | ICD-10-CM | POA: Diagnosis not present

## 2020-08-27 DIAGNOSIS — E1169 Type 2 diabetes mellitus with other specified complication: Secondary | ICD-10-CM | POA: Diagnosis not present

## 2020-08-27 DIAGNOSIS — I10 Essential (primary) hypertension: Secondary | ICD-10-CM | POA: Diagnosis not present

## 2020-08-27 DIAGNOSIS — E7800 Pure hypercholesterolemia, unspecified: Secondary | ICD-10-CM | POA: Diagnosis not present

## 2020-09-23 DIAGNOSIS — Z1389 Encounter for screening for other disorder: Secondary | ICD-10-CM | POA: Diagnosis not present

## 2020-09-23 DIAGNOSIS — I700 Atherosclerosis of aorta: Secondary | ICD-10-CM | POA: Diagnosis not present

## 2020-09-23 DIAGNOSIS — Z0000 Encounter for general adult medical examination without abnormal findings: Secondary | ICD-10-CM | POA: Diagnosis not present

## 2020-09-23 DIAGNOSIS — Z23 Encounter for immunization: Secondary | ICD-10-CM | POA: Diagnosis not present

## 2020-09-23 DIAGNOSIS — Z79899 Other long term (current) drug therapy: Secondary | ICD-10-CM | POA: Diagnosis not present

## 2020-09-23 DIAGNOSIS — E1169 Type 2 diabetes mellitus with other specified complication: Secondary | ICD-10-CM | POA: Diagnosis not present

## 2020-09-23 DIAGNOSIS — I10 Essential (primary) hypertension: Secondary | ICD-10-CM | POA: Diagnosis not present

## 2020-09-23 DIAGNOSIS — E7800 Pure hypercholesterolemia, unspecified: Secondary | ICD-10-CM | POA: Diagnosis not present

## 2020-10-04 DIAGNOSIS — H02403 Unspecified ptosis of bilateral eyelids: Secondary | ICD-10-CM | POA: Diagnosis not present

## 2020-10-15 DIAGNOSIS — E1169 Type 2 diabetes mellitus with other specified complication: Secondary | ICD-10-CM | POA: Diagnosis not present

## 2020-10-15 DIAGNOSIS — I10 Essential (primary) hypertension: Secondary | ICD-10-CM | POA: Diagnosis not present

## 2020-10-15 DIAGNOSIS — E7800 Pure hypercholesterolemia, unspecified: Secondary | ICD-10-CM | POA: Diagnosis not present

## 2020-11-12 ENCOUNTER — Emergency Department (HOSPITAL_COMMUNITY): Payer: PPO

## 2020-11-12 ENCOUNTER — Inpatient Hospital Stay (HOSPITAL_COMMUNITY)
Admission: EM | Admit: 2020-11-12 | Discharge: 2020-11-16 | DRG: 871 | Disposition: A | Discharge status: Home / Self Care | Payer: PPO | Attending: Family Medicine | Admitting: Family Medicine

## 2020-11-12 ENCOUNTER — Other Ambulatory Visit: Payer: Self-pay

## 2020-11-12 ENCOUNTER — Encounter (HOSPITAL_COMMUNITY): Payer: Self-pay

## 2020-11-12 DIAGNOSIS — Z79899 Other long term (current) drug therapy: Secondary | ICD-10-CM

## 2020-11-12 DIAGNOSIS — I77810 Thoracic aortic ectasia: Secondary | ICD-10-CM | POA: Diagnosis present

## 2020-11-12 DIAGNOSIS — J189 Pneumonia, unspecified organism: Secondary | ICD-10-CM | POA: Diagnosis present

## 2020-11-12 DIAGNOSIS — Z7952 Long term (current) use of systemic steroids: Secondary | ICD-10-CM

## 2020-11-12 DIAGNOSIS — E785 Hyperlipidemia, unspecified: Secondary | ICD-10-CM | POA: Diagnosis not present

## 2020-11-12 DIAGNOSIS — J90 Pleural effusion, not elsewhere classified: Secondary | ICD-10-CM | POA: Diagnosis not present

## 2020-11-12 DIAGNOSIS — E871 Hypo-osmolality and hyponatremia: Secondary | ICD-10-CM | POA: Diagnosis not present

## 2020-11-12 DIAGNOSIS — J9690 Respiratory failure, unspecified, unspecified whether with hypoxia or hypercapnia: Secondary | ICD-10-CM | POA: Diagnosis not present

## 2020-11-12 DIAGNOSIS — A419 Sepsis, unspecified organism: Secondary | ICD-10-CM | POA: Diagnosis not present

## 2020-11-12 DIAGNOSIS — E1165 Type 2 diabetes mellitus with hyperglycemia: Secondary | ICD-10-CM | POA: Diagnosis present

## 2020-11-12 DIAGNOSIS — Z886 Allergy status to analgesic agent status: Secondary | ICD-10-CM | POA: Diagnosis not present

## 2020-11-12 DIAGNOSIS — Z7984 Long term (current) use of oral hypoglycemic drugs: Secondary | ICD-10-CM

## 2020-11-12 DIAGNOSIS — R509 Fever, unspecified: Secondary | ICD-10-CM | POA: Diagnosis not present

## 2020-11-12 DIAGNOSIS — Z20822 Contact with and (suspected) exposure to covid-19: Secondary | ICD-10-CM | POA: Diagnosis present

## 2020-11-12 DIAGNOSIS — E878 Other disorders of electrolyte and fluid balance, not elsewhere classified: Secondary | ICD-10-CM | POA: Diagnosis present

## 2020-11-12 DIAGNOSIS — I700 Atherosclerosis of aorta: Secondary | ICD-10-CM | POA: Diagnosis present

## 2020-11-12 DIAGNOSIS — Z66 Do not resuscitate: Secondary | ICD-10-CM | POA: Diagnosis not present

## 2020-11-12 DIAGNOSIS — E861 Hypovolemia: Secondary | ICD-10-CM | POA: Diagnosis present

## 2020-11-12 DIAGNOSIS — Z7982 Long term (current) use of aspirin: Secondary | ICD-10-CM

## 2020-11-12 DIAGNOSIS — E119 Type 2 diabetes mellitus without complications: Secondary | ICD-10-CM | POA: Diagnosis not present

## 2020-11-12 DIAGNOSIS — I10 Essential (primary) hypertension: Secondary | ICD-10-CM | POA: Diagnosis present

## 2020-11-12 LAB — CBC WITH DIFFERENTIAL/PLATELET
Abs Immature Granulocytes: 0.03 10*3/uL (ref 0.00–0.07)
Abs Immature Granulocytes: 0.05 10*3/uL (ref 0.00–0.07)
Basophils Absolute: 0 10*3/uL (ref 0.0–0.1)
Basophils Absolute: 0 10*3/uL (ref 0.0–0.1)
Basophils Relative: 0 %
Basophils Relative: 0 %
Eosinophils Absolute: 0 10*3/uL (ref 0.0–0.5)
Eosinophils Absolute: 0 10*3/uL (ref 0.0–0.5)
Eosinophils Relative: 0 %
Eosinophils Relative: 0 %
HCT: 39.1 % (ref 39.0–52.0)
HCT: 37.4 % — ABNORMAL LOW (ref 39.0–52.0)
Hemoglobin: 13.3 g/dL (ref 13.0–17.0)
Hemoglobin: 12.8 g/dL — ABNORMAL LOW (ref 13.0–17.0)
Immature Granulocytes: 0 %
Immature Granulocytes: 1 %
Lymphocytes Relative: 4 %
Lymphocytes Relative: 6 %
Lymphs Abs: 0.5 10*3/uL — ABNORMAL LOW (ref 0.7–4.0)
Lymphs Abs: 0.6 10*3/uL — ABNORMAL LOW (ref 0.7–4.0)
MCH: 31.6 pg (ref 26.0–34.0)
MCH: 31.8 pg (ref 26.0–34.0)
MCHC: 34 g/dL (ref 30.0–36.0)
MCHC: 34.2 g/dL (ref 30.0–36.0)
MCV: 92.9 fL (ref 80.0–100.0)
MCV: 93 fL (ref 80.0–100.0)
Monocytes Absolute: 0.9 10*3/uL (ref 0.1–1.0)
Monocytes Absolute: 0.6 10*3/uL (ref 0.1–1.0)
Monocytes Relative: 8 %
Monocytes Relative: 6 %
Neutro Abs: 9.9 10*3/uL — ABNORMAL HIGH (ref 1.7–7.7)
Neutro Abs: 8.8 10*3/uL — ABNORMAL HIGH (ref 1.7–7.7)
Neutrophils Relative %: 88 %
Neutrophils Relative %: 87 %
Platelets: 229 10*3/uL (ref 150–400)
Platelets: 218 10*3/uL (ref 150–400)
RBC: 4.21 MIL/uL — ABNORMAL LOW (ref 4.22–5.81)
RBC: 4.02 MIL/uL — ABNORMAL LOW (ref 4.22–5.81)
RDW: 13 % (ref 11.5–15.5)
RDW: 12.9 % (ref 11.5–15.5)
WBC: 11.3 10*3/uL — ABNORMAL HIGH (ref 4.0–10.5)
WBC: 10.1 10*3/uL (ref 4.0–10.5)
nRBC: 0 % (ref 0.0–0.2)
nRBC: 0 % (ref 0.0–0.2)

## 2020-11-12 LAB — RESP PANEL BY RT-PCR (FLU A&B, COVID) ARPGX2
Influenza A by PCR: NEGATIVE
Influenza B by PCR: NEGATIVE
SARS Coronavirus 2 by RT PCR: NEGATIVE

## 2020-11-12 LAB — URINALYSIS, ROUTINE W REFLEX MICROSCOPIC
Bacteria, UA: NONE SEEN
Bilirubin Urine: NEGATIVE
Glucose, UA: NEGATIVE mg/dL
Ketones, ur: 5 mg/dL — AB
Leukocytes,Ua: NEGATIVE
Nitrite: NEGATIVE
Protein, ur: 30 mg/dL — AB
Specific Gravity, Urine: 1.015 (ref 1.005–1.030)
pH: 6 (ref 5.0–8.0)

## 2020-11-12 LAB — PROTIME-INR
INR: 1.1 (ref 0.8–1.2)
Prothrombin Time: 14.4 seconds (ref 11.4–15.2)

## 2020-11-12 LAB — COMPREHENSIVE METABOLIC PANEL
ALT: 12 U/L (ref 0–44)
AST: 16 U/L (ref 15–41)
Albumin: 3.9 g/dL (ref 3.5–5.0)
Alkaline Phosphatase: 36 U/L — ABNORMAL LOW (ref 38–126)
Anion gap: 8 (ref 5–15)
BUN: 15 mg/dL (ref 8–23)
CO2: 28 mmol/L (ref 22–32)
Calcium: 8.9 mg/dL (ref 8.9–10.3)
Chloride: 94 mmol/L — ABNORMAL LOW (ref 98–111)
Creatinine, Ser: 0.91 mg/dL (ref 0.61–1.24)
GFR, Estimated: >60 mL/min (ref >60)
Glucose, Bld: 197 mg/dL — ABNORMAL HIGH (ref 70–99)
Potassium: 3.7 mmol/L (ref 3.5–5.1)
Sodium: 130 mmol/L — ABNORMAL LOW (ref 135–145)
Total Bilirubin: 1.2 mg/dL (ref 0.3–1.2)
Total Protein: 7.6 g/dL (ref 6.5–8.1)

## 2020-11-12 LAB — CBG MONITORING, ED: Glucose-Capillary: 145 mg/dL — ABNORMAL HIGH (ref 70–99)

## 2020-11-12 LAB — APTT: aPTT: 31 seconds (ref 24–36)

## 2020-11-12 LAB — LACTIC ACID, PLASMA
Lactic Acid, Venous: 1.2 mmol/L (ref 0.5–1.9)
Lactic Acid, Venous: 1.2 mmol/L (ref 0.5–1.9)

## 2020-11-12 MED ORDER — CEFTRIAXONE SODIUM 2 G IJ SOLR
2.0000 g | INTRAMUSCULAR | Status: DC
  Administered 2020-11-13 – 2020-11-16 (×4): 2 g via INTRAVENOUS
  Filled 2020-11-12 (×4): qty 2

## 2020-11-12 MED ORDER — AMLODIPINE BESYLATE 5 MG PO TABS
5.0000 mg | ORAL_TABLET | Freq: Every day | ORAL | Status: DC
  Administered 2020-11-12 – 2020-11-16 (×4): 5 mg via ORAL
  Filled 2020-11-12 (×5): qty 1

## 2020-11-12 MED ORDER — SODIUM CHLORIDE 0.9 % IV SOLN
1.0000 g | Freq: Once | INTRAVENOUS | Status: AC
  Administered 2020-11-12: 1 g via INTRAVENOUS
  Filled 2020-11-12: qty 10

## 2020-11-12 MED ORDER — SODIUM CHLORIDE 0.9 % IV SOLN
500.0000 mg | INTRAVENOUS | Status: DC
  Administered 2020-11-13: 500 mg via INTRAVENOUS
  Filled 2020-11-12: qty 500

## 2020-11-12 MED ORDER — SODIUM CHLORIDE 0.9 % IV BOLUS
1000.0000 mL | Freq: Once | INTRAVENOUS | Status: AC
  Administered 2020-11-12: 1000 mL via INTRAVENOUS

## 2020-11-12 MED ORDER — SODIUM CHLORIDE 0.9 % IV SOLN: INTRAVENOUS | Status: DC

## 2020-11-12 MED ORDER — SODIUM CHLORIDE 0.9 % IV SOLN
500.0000 mg | Freq: Once | INTRAVENOUS | Status: AC
  Administered 2020-11-12: 500 mg via INTRAVENOUS
  Filled 2020-11-12: qty 500

## 2020-11-12 MED ORDER — INSULIN ASPART 100 UNIT/ML IJ SOLN
0.0000 [IU] | Freq: Three times a day (TID) | INTRAMUSCULAR | Status: DC
  Administered 2020-11-13 – 2020-11-14 (×4): 2 [IU] via SUBCUTANEOUS
  Administered 2020-11-14 – 2020-11-15 (×2): 3 [IU] via SUBCUTANEOUS
  Administered 2020-11-15: 1 [IU] via SUBCUTANEOUS
  Administered 2020-11-15 (×2): 3 [IU] via SUBCUTANEOUS
  Administered 2020-11-16: 2 [IU] via SUBCUTANEOUS
  Administered 2020-11-16: 1 [IU] via SUBCUTANEOUS
  Filled 2020-11-12: qty 0.09

## 2020-11-12 MED ORDER — ONDANSETRON HCL 4 MG/2ML IJ SOLN: 4.0000 mg | Freq: Four times a day (QID) | INTRAMUSCULAR | Status: DC | PRN

## 2020-11-12 MED ORDER — TRAZODONE HCL 50 MG PO TABS: 25.0000 mg | ORAL_TABLET | Freq: Every evening | ORAL | Status: DC | PRN

## 2020-11-12 MED ORDER — ASPIRIN 81 MG PO TABS: 81.0000 mg | ORAL_TABLET | Freq: Every day | ORAL | Status: DC

## 2020-11-12 MED ORDER — ENOXAPARIN SODIUM 40 MG/0.4ML IJ SOSY
40.0000 mg | PREFILLED_SYRINGE | INTRAMUSCULAR | Status: DC
  Administered 2020-11-12 – 2020-11-15 (×4): 40 mg via SUBCUTANEOUS
  Filled 2020-11-12 (×4): qty 0.4

## 2020-11-12 MED ORDER — ONDANSETRON HCL 4 MG PO TABS: 4.0000 mg | ORAL_TABLET | Freq: Four times a day (QID) | ORAL | Status: DC | PRN

## 2020-11-12 MED ORDER — PRAVASTATIN SODIUM 20 MG PO TABS
10.0000 mg | ORAL_TABLET | Freq: Every day | ORAL | Status: DC
  Administered 2020-11-12 – 2020-11-15 (×4): 10 mg via ORAL
  Filled 2020-11-12 (×4): qty 1

## 2020-11-12 MED ORDER — AZITHROMYCIN 500 MG IV SOLR: 500.0000 mg | INTRAVENOUS | Status: DC

## 2020-11-12 MED ORDER — OMEGA-3-ACID ETHYL ESTERS 1 G PO CAPS: 1.0000 g | ORAL_CAPSULE | Freq: Every day | ORAL | Status: DC

## 2020-11-12 MED ORDER — MAGNESIUM HYDROXIDE 400 MG/5ML PO SUSP: 30.0000 mL | Freq: Every day | ORAL | Status: DC | PRN

## 2020-11-12 MED ORDER — ACETAMINOPHEN 325 MG PO TABS
650.0000 mg | ORAL_TABLET | Freq: Once | ORAL | Status: AC
  Administered 2020-11-12: 650 mg via ORAL
  Filled 2020-11-12: qty 2

## 2020-11-12 MED ORDER — HYDROCHLOROTHIAZIDE 25 MG PO TABS
25.0000 mg | ORAL_TABLET | Freq: Every day | ORAL | Status: DC
  Administered 2020-11-12: 25 mg via ORAL
  Filled 2020-11-12 (×2): qty 1

## 2020-11-12 MED ORDER — PRAVASTATIN SODIUM 20 MG PO TABS: 10.0000 mg | ORAL_TABLET | Freq: Every day | ORAL | Status: DC

## 2020-11-12 MED ORDER — IBUPROFEN 200 MG PO TABS
400.0000 mg | ORAL_TABLET | ORAL | Status: DC | PRN
  Administered 2020-11-12 – 2020-11-13 (×2): 400 mg via ORAL
  Filled 2020-11-12 (×2): qty 2

## 2020-11-12 NOTE — ED Notes (Signed)
Patient transported to CT 

## 2020-11-12 NOTE — ED Provider Notes (Signed)
I provided a substantive portion of the care of this patient.  I personally performed the entirety of the medical decision making for this encounter.     87 year old male presents fatigue and fever since yesterday.  Has evidence of pneumonia on chest x-ray.  Febrile here to 104.  Fever treated with Tylenol.*IV antibiotics.  Sepsis protocol started.  Will be admitted to the hospitalist service   Lorre Nick, MD 11/12/20 1945

## 2020-11-12 NOTE — H&P (Addendum)
[place]   PATIENT NAME: Timothy Reilly    MR#:  [id number]  DATE OF BIRTH:  December 02, 1932  DATE OF ADMISSION:  11/12/2020  PRIMARY CARE PHYSICIAN: Merlene Laughter, MD   Patient is coming from: Home  REQUESTING/REFERRING PHYSICIAN: Lorelee New, PA-C  CHIEF COMPLAINT:   Chief Complaint  Patient presents with   Fever   Fatigue    HISTORY OF PRESENT ILLNESS:  Timothy Reilly is a 87 y.o. male with medical history significant for type 2 diabetes mellitus, hypertension and dyslipidemia, who presented to the emergency room with acute onset of fever and generalized fatigue and tiredness today.  The patient is usually fairly active and is often in his garden and exercises regularly.  His T-max was up to 102.  He took Tylenol and cold compresses were applied to his forehead.  He states she came down below 101 and then rebounded back to 102.  His blood glucose level has been elevated over the last 24 hours.  He is fully vaccinated for COVID-19.  No recent sick exposure.  He denies any chest pain or dyspnea or cough or wheezing.  No neck pain or stiffness.  No headache or dizziness or blurred vision.  Denies any nausea or vomiting or abdominal pain.  No dysuria urinary frequency urgency or oliguria or flank pain.  ED Course: Temperature was 101.1 and later 104.3 and heart rate 95 with otherwise normal vital signs.Labs revealed hyponatremia hypochloremia and hyperglycemia.  Lactic acid was 1.2 and CBC showed WBC of 11.  Imaging: Two-view chest x-ray showed small focus right basal airspace opacity concerning for pneumonia.  The patient was given 650 mg p.o. Tylenol, IV Rocephin and Zithromax and 1 L bolus of IV normal saline.  He will be admitted to a medical monitored bed for further evaluation and management. PAST MEDICAL HISTORY:   Past Medical History:  Diagnosis Date   Diabetes mellitus without complication (HCC)    Hyperlipidemia    Hypertension     PAST SURGICAL HISTORY:   Past  Surgical History:  Procedure Laterality Date   EYE SURGERY      SOCIAL HISTORY:   Social History   Tobacco Use   Smoking status: Never   Smokeless tobacco: Never  Substance Use Topics   Alcohol use: Never    FAMILY HISTORY:  History reviewed. No pertinent family history.  DRUG ALLERGIES:   Allergies  Allergen Reactions   Acetaminophen     REVIEW OF SYSTEMS:   ROS As per history of present illness. All pertinent systems were reviewed above. Constitutional, HEENT, cardiovascular, respiratory, GI, GU, musculoskeletal, neuro, psychiatric, endocrine, integumentary and hematologic systems were reviewed and are otherwise negative/unremarkable except for positive findings mentioned above in the HPI.   MEDICATIONS AT HOME:   Prior to Admission medications   Medication Sig Start Date End Date Taking? Authorizing Provider  amLODipine (NORVASC) 5 MG tablet Take 5 mg by mouth daily.    [provider]  aspirin 81 MG tablet Take 81 mg by mouth daily.    [provider]  Coenzyme Q10 (COQ10) 100 MG CAPS Take by mouth.    [provider]  Glucosamine HCl-MSM 1500-500 MG/30ML LIQD Take by mouth.    [provider]  hydrochlorothiazide (HYDRODIURIL) 25 MG tablet Take 25 mg by mouth daily.    [provider]  metFORMIN (GLUCOPHAGE-XR) 500 MG 24 hr tablet Take 500 mg by mouth daily with breakfast.    [provider]  Omega-3 Fatty Acids (FISH OIL) 1200 MG CAPS Take by mouth 2 (two) times daily.    [provider]  pravastatin (PRAVACHOL) 10 MG tablet Take 10 mg by mouth daily.    [provider]  predniSONE (DELTASONE) 20 MG tablet Take 3 tablets (60 mg total) by mouth daily. 03/05/17   Bethel Born, PA-C  UNABLE TO FIND Pt takes Glucose Reduce; 1 tablet every day    [provider]      VITAL SIGNS:  Blood pressure 115/64, pulse 86, temperature 99.4 F (37.4 C), temperature source Oral, resp. rate  16, height 5\' 7"  (1.702 m), weight 57.2 kg, SpO2 98 %.  PHYSICAL EXAMINATION:  Physical Exam  GENERAL:  87 y.o.-year-old male patient lying in the bed with no acute distress.  EYES: Pupils equal, round, reactive to light and accommodation. No scleral icterus. Extraocular muscles intact.  HEENT: Head atraumatic, normocephalic. Oropharynx and nasopharynx clear.  NECK:  Supple, no jugular venous distention. No thyroid enlargement, no tenderness.  LUNGS: Slightly diminished right basal breath sounds with minimal crackles. CARDIOVASCULAR: Regular rate and rhythm, S1, S2 normal. No murmurs, rubs, or gallops.  ABDOMEN: Soft, nondistended, nontender. Bowel sounds present. No organomegaly or mass.  EXTREMITIES: No pedal edema, cyanosis, or clubbing.  NEUROLOGIC: Cranial nerves II through XII are intact. Muscle strength 5/5 in all extremities. Sensation intact. Gait not checked.  PSYCHIATRIC: The patient is alert and oriented x 3.  Normal affect and good eye contact. SKIN: No obvious rash, lesion, or ulcer.   LABORATORY PANEL:   CBC Recent Labs  Lab 11/12/20 1502  WBC 11.3*  HGB 13.3  HCT 39.1  PLT 229   ------------------------------------------------------------------------------------------------------------------  Chemistries  Recent Labs  Lab 11/12/20 1502  NA 130*  K 3.7  CL 94*  CO2 28  GLUCOSE 197*  BUN 15  CREATININE 0.91  CALCIUM 8.9  AST 16  ALT 12  ALKPHOS 36*  BILITOT 1.2   ------------------------------------------------------------------------------------------------------------------  Cardiac Enzymes No results for input(s): TROPONINI in the last 168 hours. ------------------------------------------------------------------------------------------------------------------  RADIOLOGY:  DG Chest 2 View  Result Date: 11/12/2020 CLINICAL DATA:  Fever for the past 2 days. EXAM: CHEST - 2 VIEW COMPARISON:  None. FINDINGS: There is a small focus of airspace  opacity in the right lung base worrisome for pneumonia. The left lung is clear. No pneumothorax or pleural effusion. Heart size is normal. Aortic atherosclerosis noted. IMPRESSION: Small focus right basilar airspace opacity worrisome for pneumonia. Aortic Atherosclerosis (ICD10-I70.0). Electronically Signed   By: 11/14/2020 M.D.   On: 11/12/2020 15:37      IMPRESSION AND PLAN:  Active Problems:   CAP (community acquired pneumonia)  1.  Right basal [facility] pneumonia with subsequent sepsis.  This is manifested by high fever and mild tachycardia. - The patient will be admitted to a medical monitored bed. - We will continue antibiotic therapy with IV Rocephin and Zithromax. - We will place the patient on mucolytic therapy and as needed bronchodilator therapy. - We will follow blood cultures and obtain sputum culture. - Antipyretics will be provided and cooling blanket will be used as needed.  2.  Hyponatremia and hypochloremia likely hypovolemic. - The patient will be hydrated with IV normal saline and will follow his BMP. - We will hold off HCTZ.  3.  Type 2 diabetes mellitus. - The patient will be placed on supplement coverage with NovoLog. - We will hold off metformin.  4.  Dyslipidemia. - We  will continue statin therapy.  5.  Essential hypertension. - We will continue Norvasc and hold off HCTZ..  DVT prophylaxis: Lovenox. Code Status: This was discussed with the patient and he desires to be DNR/DNI. Family Communication:  The plan of care was discussed in details with the patient (and family). I answered all questions. The patient agreed to proceed with the above mentioned plan. Further management will depend upon hospital course. Disposition Plan: Back to previous home environment Consults called: none. All the records are reviewed and case discussed with ED provider.  Status is: Inpatient  Remains inpatient appropriate because:Ongoing diagnostic testing needed not  appropriate for outpatient work up, Unsafe d/c plan, IV treatments appropriate due to intensity of illness or inability to take PO, and Inpatient level of care appropriate due to severity of illness  Dispo: The patient is from: Home              Anticipated d/c is to: Home              Patient currently is not medically stable to d/c.   Difficult to place patient No   TOTAL TIME TAKING CARE OF THIS PATIENT: 55 minutes.    Hannah Beat M.D on 11/12/2020 at 8:11 PM  [facility]   From 7 PM-7 AM, contact night-coverage [email or web address]  CC: Primary care physician; Merlene Laughter, MD

## 2020-11-12 NOTE — ED Provider Notes (Signed)
Emergency Medicine Provider Triage Evaluation Note  Timothy Reilly , a 87 y.o. male  was evaluated in triage.  Pt complains of fever, onset yesterday, normally very healthy and active. Yesterday, fatigued, sat in a chair all day yesterday and went to bed early. Did not wake up at 5am today to go swimming as he usually would. Temp 102.1 at home, given Tylenol at 5:15am today, applied cold compress. Temp improved to 99.5. Patient later developed chills/very shaky, complained of feeling cold, temp recheck 99, given blanket and sat on the sofa before coming to the ER.  Temp 101.1 in triage Review of Systems  Positive: fever Negative: Cough, body aches, abdominal pain, changes in bowel or bladder habits   Physical Exam  There were no vitals taken for this visit. Gen:   Awake, no distress   Resp:  Normal effort  MSK:   Moves extremities without difficulty  Other:  Respirations even and unlaboted.   Medical Decision Making  Medically screening exam initiated at 2:44 PM.  Appropriate orders placed.  EDGERRIN CORREIA was informed that the remainder of the evaluation will be completed by another provider, this initial triage assessment does not replace that evaluation, and the importance of remaining in the ED until their evaluation is complete.     Jeannie Fend, PA-C 11/12/20 1449    Derwood Kaplan, MD 11/13/20 [phone]

## 2020-11-12 NOTE — ED Provider Notes (Signed)
Timothy Re[facility]-EMERGENCY DEPT Provider Note   CSN: [id number] Arrival date & time: 11/12/20  1344     History Chief Complaint  Patient presents with   Fever   Fatigue    Timothy Reilly is a 87 y.o. male with past medical history significant for HTN, HLD, and type II DM who presents the ED with a 1 day history of fevers and fatigue.  On my examination, patient is accompanied by his wife was at bedside.  He is a very active 87 year old male who is often in the garden and exercising regularly.  His wife states that he never gets sick.  Yesterday he was very fatigued throughout the day, he states that he felt miserable and weak.  He denies any other obvious symptoms.  Wife states that he slept in today later than usual and when she checked on him he was running a fever, temperature of 102 F.  She treated his fever appropriately with Tylenol and cool compress on the forehead.  She states that it came down briefly, but then rebounded back to 101 to 102 F.  This prompted him to come to the ED for evaluation.  Wife also adds that his sugars have been elevated over the course of the past 24 hours beyond his baseline.  He is fully immunized for COVID-19.  They deny any obvious sick contacts.  Wife has been feeling well.  He denies any chest pain, shortness of breath, cough, neck pain, headache, dizziness, blurred vision, abdominal pain, nausea or vomiting, rashes, dysuria, increased urinary frequency, or changes in his bowel habits.  HPI     Past Medical History:  Diagnosis Date   Diabetes mellitus without complication (HCC)    Hyperlipidemia    Hypertension     Patient Active Problem List   Diagnosis Date Noted   CAP (community acquired pneumonia) 11/12/2020     Past Surgical History:  Procedure Laterality Date   EYE SURGERY         History reviewed. No pertinent family history.  Social History   Tobacco Use   Smoking status: Never   Smokeless tobacco:  Never  Vaping Use   Vaping Use: Never used  Substance Use Topics   Alcohol use: Never   Drug use: Never    Home Medications Prior to Admission medications   Medication Sig Start Date End Date Taking? Authorizing Provider  amLODipine (NORVASC) 5 MG tablet Take 5 mg by mouth daily.    [provider]  aspirin 81 MG tablet Take 81 mg by mouth daily.    [provider]  Coenzyme Q10 (COQ10) 100 MG CAPS Take by mouth.    [provider]  Glucosamine HCl-MSM 1500-500 MG/30ML LIQD Take by mouth.    [provider]  hydrochlorothiazide (HYDRODIURIL) 25 MG tablet Take 25 mg by mouth daily.    [provider]  metFORMIN (GLUCOPHAGE-XR) 500 MG 24 hr tablet Take 500 mg by mouth daily with breakfast.    [provider]  Omega-3 Fatty Acids (FISH OIL) 1200 MG CAPS Take by mouth 2 (two) times daily.    [provider]  pravastatin (PRAVACHOL) 10 MG tablet Take 10 mg by mouth daily.    [provider]  predniSONE (DELTASONE) 20 MG tablet Take 3 tablets (60 mg total) by mouth daily. 03/05/17   Bethel Born, PA-C  UNABLE TO FIND Pt takes Glucose Reduce; 1 tablet every day    [provider]  Allergies    Acetaminophen  Review of Systems   Review of Systems  All other systems reviewed and are negative.  Physical Exam Updated Vital Signs BP 115/64   Pulse 86   Temp 99.4 F (37.4 C) (Oral)   Resp 16   Ht 5\' 7"  (1.702 m)   Wt 57.2 kg   SpO2 98%   BMI 19.73 kg/m   Physical Exam Vitals and nursing note reviewed. Exam conducted with a chaperone present.  Constitutional:      Appearance: He is ill-appearing.     Comments: Shaking, chills.  HENT:     Head: Normocephalic and atraumatic.     Mouth/Throat:     Pharynx: Oropharynx is clear.  Eyes:     General: No scleral icterus.    Conjunctiva/sclera: Conjunctivae normal.  Cardiovascular:     Rate and Rhythm: Regular rhythm. Tachycardia present.      Pulses: Normal pulses.  Pulmonary:     Effort: Pulmonary effort is normal. No respiratory distress.     Breath sounds: Normal breath sounds. No wheezing or rales.  Abdominal:     General: Abdomen is flat. There is no distension.     Palpations: Abdomen is soft.     Tenderness: There is no abdominal tenderness. There is no guarding.  Musculoskeletal:        General: Normal range of motion.     Cervical back: Normal range of motion.  Skin:    General: Skin is dry.     Findings: No rash.  Neurological:     General: No focal deficit present.     Mental Status: He is alert and oriented to person, place, and time.     GCS: GCS eye subscore is 4. GCS verbal subscore is 5. GCS motor subscore is 6.  Psychiatric:        Mood and Affect: Mood normal.        Behavior: Behavior normal.        Thought Content: Thought content normal.    ED Results / Procedures / Treatments   Labs (all labs ordered are listed, but only abnormal results are displayed) Labs Reviewed  COMPREHENSIVE METABOLIC PANEL - Abnormal; Notable for the following components:      Result Value   Sodium 130 (*)    Chloride 94 (*)    Glucose, Bld 197 (*)    Alkaline Phosphatase 36 (*)    All other components within normal limits  CBC WITH DIFFERENTIAL/PLATELET - Abnormal; Notable for the following components:   WBC 11.3 (*)    RBC 4.21 (*)    Neutro Abs 9.9 (*)    Lymphs Abs 0.5 (*)    All other components within normal limits  URINALYSIS, ROUTINE W REFLEX MICROSCOPIC - Abnormal; Notable for the following components:   Hgb urine dipstick SMALL (*)    Ketones, ur 5 (*)    Protein, ur 30 (*)    All other components within normal limits  RESP PANEL BY RT-PCR (FLU A&B, COVID) ARPGX2  CULTURE, BLOOD (ROUTINE X 2)  CULTURE, BLOOD (ROUTINE X 2)  LACTIC ACID, PLASMA    EKG None  Radiology DG Chest 2 View  Result Date: 11/12/2020 CLINICAL DATA:  Fever for the past 2 days. EXAM: CHEST - 2 VIEW COMPARISON:  None.  FINDINGS: There is a small focus of airspace opacity in the right lung base worrisome for pneumonia. The left lung is clear. No pneumothorax or pleural effusion. Heart size is normal.  Aortic atherosclerosis noted. IMPRESSION: Small focus right basilar airspace opacity worrisome for pneumonia. Aortic Atherosclerosis (ICD10-I70.0). Electronically Signed   By: Drusilla Kanner M.D.   On: 11/12/2020 15:37    Procedures Procedures   Medications Ordered in ED Medications  sodium chloride 0.9 % bolus 1,000 mL (0 mLs Intravenous Stopped 11/12/20 1901)  acetaminophen (TYLENOL) tablet 650 mg (650 mg Oral Given 11/12/20 1731)  cefTRIAXone (ROCEPHIN) 1 g in sodium chloride 0.9 % 100 mL IVPB (1 g Intravenous New Bag/Given 11/12/20 1856)  azithromycin (ZITHROMAX) 500 mg in sodium chloride 0.9 % 250 mL IVPB (500 mg Intravenous New Bag/Given 11/12/20 1900)    ED Course  I have reviewed the triage vital signs and the nursing notes.  Pertinent labs & imaging results that were available during my care of the patient were reviewed by me and considered in my medical decision making (see chart for details).  Clinical Course as of 11/12/20 2005  Tue Nov 12, 2020  2002 I spoke with hospitalist, Dr. Arville Care, who will see and admit patient. [GG]    Clinical Course User Index [GG] Lorelee New, PA-C   MDM Rules/Calculators/A&P                          IRWIN TORAN was evaluated in Emergency Department on 11/12/2020 for the symptoms described in the history of present illness. He was evaluated in the context of the global COVID-19 pandemic, which necessitated consideration that the patient might be at risk for infection with the SARS-CoV-2 virus that causes COVID-19. Institutional protocols and algorithms that pertain to the evaluation of patients at risk for COVID-19 are in a state of rapid change based on information released by regulatory bodies including the CDC and federal and state organizations. These policies  and algorithms were followed during the patient's care in the ED.  I personally reviewed patient's medical chart and all notes from triage and staff during today's encounter. I have also ordered and reviewed all labs and imaging that I felt to be medically necessary in the evaluation of this patient's complaints and with consideration of their physical exam. If needed, translation services were available and utilized.   Patient in the ED for a 1 day history of weakness and fever, unclear etiology.  We will proceed with comprehensive laboratory work-up and plain films of chest for further evaluation.  Abdomen is soft, nontender.  He denies any abdominal symptoms, urinary symptoms, or changes in his bowel habits.  Laboratory work-up is obtained and largely reassuring.  Normal lactic acid at 1.2.  Can discontinue second lactic acid.  He does have a mild leukocytosis to 11.3.  CMP notable only for mild hyponatremia to 130.  UA is without any evidence of infection.  While work-up is pending, patient became abruptly confused and stood up out of the bed, ripping off his brief and acting confused.  His wife is concerned.  Rectal temperature obtained was notable for fever of 104.3 F.  Likely the culprit for his confusion.  IV fluids have been ordered, but had not yet been initiated.  Tylenol 650 mg ordered for his fever.  There is a Tylenol allergy listed in his chart, but wife at bedside states that he takes it regularly without any complication or issue.  Patient is very weak.  Respiratory panel by PCR is negative.  No abdominal symptoms.  No meningismus on exam.  Urine is clean.  The only finding is possible  pneumonia on chest x-ray.  We will treat with azithromycin and Rocephin.  Curb 65 score of 2.  Will consult hospitalist for observation admission and continued IV antibiotics.  Follow blood cultures.    I spoke with hospitalist, Dr. Arville Care, who will see and admit patient.   Final Clinical Impression(s)  / ED Diagnoses Final diagnoses:  Community acquired pneumonia of right lower lobe of lung    Rx / DC Orders ED Discharge Orders     None        Elvera Maria 11/12/20 2005    Lorre Nick, MD 11/14/20 2255

## 2020-11-12 NOTE — ED Triage Notes (Signed)
Patient has been feeling fatigue since yesterday. Patient had a fever of 102 today. Patient was given Tylenol ES x 1 at 0930 by his wife .  Patient denies any SOB, cough, N/v/D, or abdominal pain.

## 2020-11-13 ENCOUNTER — Inpatient Hospital Stay (HOSPITAL_COMMUNITY): Payer: PPO

## 2020-11-13 LAB — CBC
HCT: 39.4 % (ref 39.0–52.0)
Hemoglobin: 12.8 g/dL — ABNORMAL LOW (ref 13.0–17.0)
MCH: 30.5 pg (ref 26.0–34.0)
MCHC: 32.5 g/dL (ref 30.0–36.0)
MCV: 94 fL (ref 80.0–100.0)
Platelets: 193 10*3/uL (ref 150–400)
RBC: 4.19 MIL/uL — ABNORMAL LOW (ref 4.22–5.81)
RDW: 13.2 % (ref 11.5–15.5)
WBC: 9 10*3/uL (ref 4.0–10.5)
nRBC: 0 % (ref 0.0–0.2)

## 2020-11-13 LAB — PHOSPHORUS: Phosphorus: 2.7 mg/dL (ref 2.5–4.6)

## 2020-11-13 LAB — GLUCOSE, CAPILLARY
Glucose-Capillary: 118 mg/dL — ABNORMAL HIGH (ref 70–99)
Glucose-Capillary: 162 mg/dL — ABNORMAL HIGH (ref 70–99)
Glucose-Capillary: 169 mg/dL — ABNORMAL HIGH (ref 70–99)
Glucose-Capillary: 171 mg/dL — ABNORMAL HIGH (ref 70–99)

## 2020-11-13 LAB — BASIC METABOLIC PANEL
Anion gap: 8 (ref 5–15)
BUN: 12 mg/dL (ref 8–23)
CO2: 28 mmol/L (ref 22–32)
Calcium: 8.7 mg/dL — ABNORMAL LOW (ref 8.9–10.3)
Chloride: 101 mmol/L (ref 98–111)
Creatinine, Ser: 0.88 mg/dL (ref 0.61–1.24)
GFR, Estimated: >60 mL/min (ref >60)
Glucose, Bld: 138 mg/dL — ABNORMAL HIGH (ref 70–99)
Potassium: 3.6 mmol/L (ref 3.5–5.1)
Sodium: 137 mmol/L (ref 135–145)

## 2020-11-13 LAB — CORTISOL: Cortisol, Plasma: 18.4 ug/dL

## 2020-11-13 LAB — LACTIC ACID, PLASMA: Lactic Acid, Venous: 1.1 mmol/L (ref 0.5–1.9)

## 2020-11-13 MED ORDER — ACETAMINOPHEN 500 MG PO TABS
1000.0000 mg | ORAL_TABLET | Freq: Three times a day (TID) | ORAL | Status: DC
  Administered 2020-11-13 – 2020-11-16 (×10): 1000 mg via ORAL
  Filled 2020-11-13 (×10): qty 2

## 2020-11-13 NOTE — ED Notes (Addendum)
Patient will sudden episode of confusion, tried to get out of bed, ripped his brief and vitals equipment off, stating he wants to go home. Wife very upset and tearful.  Patient gotten back in bed and placed new brief, rectal temp nat this time is 104.3.  Gerre Pebbles, PA made aware of temp and situation.  New orders received for tylenol and soft restraints.  Posey belt placed on patient to keep him safe, will hold off on soft restraints at this time, patient verbalized understanding that if he starts to pull things off again soft wrist restraints will be applied.  Wife at bedside and understands situation, all questions address by this nurse as well as Gerre Pebbles, [place].  Encouraged patient wife to eat something as she has only had an apple today because she is so worried about her husband.  Reassure her that patient is ok and we are treating everything.  She was provide with water and food.

## 2020-11-13 NOTE — Progress Notes (Signed)
PROGRESS NOTE    Timothy Reilly  ZOX:[id number] DOB: [birth year] DOA: 11/12/2020 PCP: Merlene LaughterStoneking, Hal, MD   Chief Complaint  Patient presents with   Fever   Fatigue    Brief Narrative:  Timothy MinervaGuang C Skillman is Timothy Reilly 87 y.o. male with medical history significant for type 2 diabetes mellitus, hypertension and dyslipidemia, who presented to the emergency room with acute onset of fever and generalized fatigue and tiredness today.  The patient is usually fairly active and is often in his garden and exercises regularly.  His T-max was up to 102.  He took Tylenol and cold compresses were applied to his forehead.  He states she came down below 101 and then rebounded back to 102.  His blood glucose level has been elevated over the last 24 hours.  He is fully vaccinated for COVID-19.  No recent sick exposure.  He denies any chest pain or dyspnea or cough or wheezing.  No neck pain or stiffness.  No headache or dizziness or blurred vision.  Denies any nausea or vomiting or abdominal pain.  No dysuria urinary frequency urgency or oliguria or flank pain.   ED Course: Temperature was 101.1 and later 104.3 and heart rate 95 with otherwise normal vital signs.Labs revealed hyponatremia hypochloremia and hyperglycemia.  Lactic acid was 1.2 and CBC showed WBC of 11.   Imaging: Two-view chest x-ray showed small focus right basal airspace opacity concerning for pneumonia.  The patient was given 650 mg p.o. Tylenol, IV Rocephin and Zithromax and 1 L bolus of IV normal saline.  He will be admitted to [facility] monitored bed for further evaluation and management.  Assessment & Plan:   Active Problems:   CAP (community acquired pneumonia)   1.  Sepsis 2/2 Community Acquired Pneumonia .  This is manifested by high fever and mild tachycardia. - ceftriaxone, azithromycin -- sputum cx, urine strep, urine legionella -- SLP eval -- blood cx pending -- CT chest with right lower lobe pneumonia - he's tolerating tylenol (unknown  allergy - wife gave him apap prior to admission) - will continue tylenol for now, ibuprofen as well for fever  2.  Hyponatremia and hypochloremia likely hypovolemic. - improved  3.  Type 2 diabetes mellitus. - The patient will be placed on supplement coverage with NovoLog. - We will hold off metformin.  4.  Dyslipidemia. - We will continue statin therapy.  5.  Essential hypertension. - We will continue Norvasc and hold off HCTZ..  Mild ectasia of ascending aorta Follow outpatient  DVT prophylaxis: lovenox Code Status: DNR Family Communication: wife at bedside Disposition:   Status is: Inpatient  Remains inpatient appropriate because:Inpatient level of care appropriate due to severity of illness  Dispo: The patient is from: Home              Anticipated d/c is to: Home              Patient currently is not medically stable to d/c.   Difficult to place patient No       Consultants:  none  Procedures:  none  Antimicrobials:  Anti-infectives (From admission, onward)    Start     Dose/Rate Route Frequency Ordered Stop   11/13/20 1800  cefTRIAXone (ROCEPHIN) 2 g in sodium chloride 0.9 % 100 mL IVPB        2 g 200 mL/hr over 30 Minutes Intravenous Every 24 hours 11/12/20 2010     11/13/20 1800  azithromycin (ZITHROMAX) 500 mg in  sodium chloride 0.9 % 250 mL IVPB        500 mg 250 mL/hr over 60 Minutes Intravenous Every 24 hours 11/12/20 2014     11/12/20 1845  cefTRIAXone (ROCEPHIN) 1 g in sodium chloride 0.9 % 100 mL IVPB        1 g 200 mL/hr over 30 Minutes Intravenous  Once 11/12/20 1840 11/12/20 1926   11/12/20 1845  azithromycin (ZITHROMAX) 500 mg in sodium chloride 0.9 % 250 mL IVPB        500 mg 250 mL/hr over 60 Minutes Intravenous  Once 11/12/20 1840 11/12/20 2000   11/12/20 1800  azithromycin (ZITHROMAX) 500 mg in sodium chloride 0.9 % 250 mL IVPB  Status:  Discontinued        500 mg 250 mL/hr over 60 Minutes Intravenous Every 24 hours 11/12/20 2010  11/12/20 2014          Subjective: C/o feeling uncomfortable, fever  Objective: Vitals:   11/13/20 1016 11/13/20 1101 11/13/20 1415 11/13/20 1450  BP: 116/60 (!) 114/92 (!) 227/198 (!) 105/94  Pulse: 86  95 87  Resp: 16  17   Temp: 98.9 F (37.2 C) 99.1 F (37.3 C) 100.3 F (37.9 C)   TempSrc: Oral Oral Oral   SpO2: 97%  96%   Weight:      Height:        Intake/Output Summary (Last 24 hours) at 11/13/2020 2000 Last data filed at 11/13/2020 1052 Gross per 24 hour  Intake 120 ml  Output 400 ml  Net -280 ml   Filed Weights   11/12/20 1449 11/13/20 0615  Weight: 57.2 kg 55.4 kg    Examination:  General exam: Appears calm and comfortable  Respiratory system: crackles R base Cardiovascular system: S1 & S2 heard, RRR.  Gastrointestinal system: Abdomen is nondistended, soft and nontender.  Central nervous system: Alert and oriented. No focal neurological deficits. Extremities:no LEE Skin: No rashes, lesions or ulcers Psychiatry: Judgement and insight appear normal. Mood & affect appropriate.     Data Reviewed: I have personally reviewed following labs and imaging studies  CBC: Recent Labs  Lab 11/12/20 1502 11/12/20 2030 11/13/20 0534  WBC 11.3* 10.1 9.0  NEUTROABS 9.9* 8.8*  --   HGB 13.3 12.8* 12.8*  HCT 39.1 37.4* 39.4  MCV 92.9 93.0 94.0  PLT 229 218 193    Basic Metabolic Panel: Recent Labs  Lab 11/12/20 1502 11/13/20 0534  NA 130* 137  K 3.7 3.6  CL 94* 101  CO2 28 28  GLUCOSE 197* 138*  BUN 15 12  CREATININE 0.91 0.88  CALCIUM 8.9 8.7*    GFR: Estimated Creatinine Clearance: 47.2 mL/min (by C-G formula based on SCr of 0.88 mg/dL).  Liver Function Tests: Recent Labs  Lab 11/12/20 1502  AST 16  ALT 12  ALKPHOS 36*  BILITOT 1.2  PROT 7.6  ALBUMIN 3.9    CBG: Recent Labs  Lab 11/12/20 2238 11/13/20 0847 11/13/20 1203 11/13/20 1712  GLUCAP 145* 118* 162* 169*     Recent Results (from the past 240 hour(s))  Resp Panel  by RT-PCR (Flu Corean Yoshimura&B, Covid) Nasopharyngeal Swab     Status: None   Collection Time: 11/12/20  4:12 PM   Specimen: Nasopharyngeal Swab; Nasopharyngeal(NP) swabs in vial transport medium  Result Value Ref Range Status   SARS Coronavirus 2 by RT PCR NEGATIVE NEGATIVE Final    Comment: (NOTE) SARS-CoV-2 target nucleic acids are NOT DETECTED.  The SARS-CoV-2 RNA  is generally detectable in upper respiratory specimens during the acute phase of infection. The lowest concentration of SARS-CoV-2 viral copies this assay can detect is 138 copies/mL. Alic Hilburn negative result does not preclude SARS-Cov-2 infection and should not be used as the sole basis for treatment or other patient management decisions. Nahiara Kretzschmar negative result may occur with  improper specimen collection/handling, submission of specimen other than nasopharyngeal swab, presence of viral mutation(s) within the areas targeted by this assay, and inadequate number of viral copies(<138 copies/mL). Pleshette Tomasini negative result must be combined with clinical observations, patient history, and epidemiological information. The expected result is Negative.  Fact Sheet for Patients:  BloggerCourse.com  Fact Sheet for Healthcare Providers:  SeriousBroker.it  This test is no t yet approved or cleared by the [place] FDA and  has been authorized for detection and/or diagnosis of SARS-CoV-2 by FDA under an Emergency Use Authorization (EUA). This EUA will remain  in effect (meaning this test can be used) for the duration of the COVID-19 declaration under Section 564(b)(1) of the Act, 21 U.S.C.section 360bbb-3(b)(1), unless the authorization is terminated  or revoked sooner.       Influenza Cammie Faulstich by PCR NEGATIVE NEGATIVE Final   Influenza B by PCR NEGATIVE NEGATIVE Final    Comment: (NOTE) The Xpert Xpress SARS-CoV-2/FLU/RSV plus assay is intended as an aid in the diagnosis of influenza from Nasopharyngeal swab  specimens and should not be used as Cacey Willow sole basis for treatment. Nasal washings and aspirates are unacceptable for Xpert Xpress SARS-CoV-2/FLU/RSV testing.  Fact Sheet for Patients: BloggerCourse.com  Fact Sheet for Healthcare Providers: SeriousBroker.it  This test is not yet approved or cleared by the [place] FDA and has been authorized for detection and/or diagnosis of SARS-CoV-2 by FDA under an Emergency Use Authorization (EUA). This EUA will remain in effect (meaning this test can be used) for the duration of the COVID-19 declaration under Section 564(b)(1) of the Act, 21 U.S.C. section 360bbb-3(b)(1), unless the authorization is terminated or revoked.  Performed at [facility], [address], [place], [place] [address]   Blood culture (routine x 2)     Status: None (Preliminary result)   Collection Time: 11/12/20  5:30 PM   Specimen: BLOOD  Result Value Ref Range Status   Specimen Description   Final    BLOOD RIGHT ANTECUBITAL Performed at [facility], [address], [place], [place] [address]    Special Requests   Final    BOTTLES DRAWN AEROBIC AND ANAEROBIC Blood Culture adequate volume Performed at [facility], [address], [place], [place] [address]    Culture   Final    NO GROWTH < 12 HOURS Performed at [facility], [address], [place], [place] [address]    Report Status PENDING  Incomplete  Blood culture (routine x 2)     Status: None (Preliminary result)   Collection Time: 11/12/20  5:44 PM   Specimen: BLOOD LEFT ARM  Result Value Ref Range Status   Specimen Description   Final    BLOOD LEFT ARM Performed at [facility], [address], [place], [place] [address]    Special Requests   Final    BOTTLES DRAWN AEROBIC AND ANAEROBIC Blood Culture adequate volume Performed at [facility], [address] [place], [place] [address]    Culture   Final    NO GROWTH < 12 HOURS Performed at [facility],  [address], [place], [place] [address]    Report Status PENDING  Incomplete         Radiology Studies: DG Chest 2 View  Result Date: 11/12/2020 CLINICAL DATA:  Fever for the past 2 days. EXAM: CHEST - 2 VIEW COMPARISON:  None. FINDINGS: There is Nai Dasch small focus of airspace opacity in the right lung base worrisome for pneumonia. The left lung is clear. No pneumothorax or pleural effusion. Heart size is normal. Aortic atherosclerosis noted. IMPRESSION: Small focus right basilar airspace opacity worrisome for pneumonia. Aortic Atherosclerosis (ICD10-I70.0). Electronically Signed   By: Drusilla Kanner M.D.   On: 11/12/2020 15:37   CT CHEST WO CONTRAST  Result Date: 11/13/2020 CLINICAL DATA:  Respiratory failure. EXAM: CT CHEST WITHOUT CONTRAST TECHNIQUE: Multidetector CT imaging of the chest was performed following the standard protocol without IV contrast. COMPARISON:  Chest radiograph November 12, 2020 FINDINGS: Cardiovascular: Normal heart size. Calcific atherosclerotic disease of the coronary arteries and aorta. Mild ectasia of the ascending aorta measuring 3.8 cm. Tortuosity of the aorta. Mediastinum/Nodes: No enlarged mediastinal or axillary lymph nodes. Thyroid gland, trachea, and esophagus demonstrate no significant findings. Lungs/Pleura: Dense airspace consolidation in the right lower lobe. No evidence of pleural effusion or pneumothorax. Upper Abdomen: No acute abnormality. Musculoskeletal: No chest wall mass or suspicious bone lesions identified. Spondylosis of the spine. IMPRESSION: 1. Right lower lobe pneumonia. 2. Calcific atherosclerotic disease of the coronary arteries and aorta. 3. Mild ectasia of the ascending aorta measuring 3.8 cm. Aortic Atherosclerosis (ICD10-I70.0). Electronically Signed   By: Ted Mcalpine M.D.   On: 11/13/2020 16:51        Scheduled  Meds:  acetaminophen  1,000 mg Oral Q8H   amLODipine  5 mg Oral Daily   enoxaparin (LOVENOX) injection  40 mg Subcutaneous Q24H   insulin aspart  0-9 Units Subcutaneous TID AC & HS   pravastatin  10 mg Oral Daily   Continuous Infusions:  sodium chloride 100 mL/hr at 11/12/20 2332   azithromycin 500 mg (11/13/20 1719)   cefTRIAXone (ROCEPHIN)  IV 2 g (11/13/20 1643)     LOS: 1 day    Time spent: over 30 min    Lacretia Nicks, MD [facility]   To contact the attending provider between 7A-7P or the covering provider during after hours 7P-7A, please log into the web site [email or web address] and access using universal [place] password for that web site. If you do not have the password, please call the hospital operator.  11/13/2020, 8:00 PM

## 2020-11-13 NOTE — ED Notes (Signed)
ED TO INPATIENT HANDOFF REPORT  Name/Age/Gender Timothy Reilly 87 y.o. male  Code Status    Code Status Orders  (From admission, onward)         Start     Ordered   11/12/20 2352  Do not attempt resuscitation (DNR)  Continuous       Question Answer Comment  In the event of cardiac or respiratory ARREST Do not call a "code blue"   In the event of cardiac or respiratory ARREST Do not perform Intubation, CPR, defibrillation or ACLS   In the event of cardiac or respiratory ARREST Use medication by any route, position, wound care, and other measures to relive pain and suffering. May use oxygen, suction and manual treatment of airway obstruction as needed for comfort.      11/12/20 2351        Code Status History    Date Active Date Inactive Code Status Order ID Comments User Context   11/12/2020 2009 11/12/2020 2351 Full Code [id number]  Mansy, Vernetta Honey, MD ED    Advance Directive Documentation   Flowsheet Row Most Recent Value  Type of Advance Directive Healthcare Power of Attorney, Living will  Pre-existing out of facility DNR order (yellow form or pink MOST form) --  "MOST" Form in Place? --      Home/SNF/Other Home  Chief Complaint CAP (community acquired pneumonia) [J18.9]  Level of Care/Admitting Diagnosis ED Disposition    ED Disposition  Admit   Condition  --   Comment  Hospital Area: [facility] [[id number]]  Level of Care: Med-Surg [16]  May admit patient to [facility] or [facility] if equivalent level of care is available:: Yes  Covid Evaluation: Confirmed COVID Negative  Diagnosis: CAP (community acquired pneumonia) [086578]  Admitting Physician: Hannah Beat [[id number]]  Attending Physician: Hannah Beat [[id number]]  Estimated length of stay: past midnight tomorrow  Certification:: I certify this patient will need inpatient services for at least 2 midnights         Medical History Past Medical History:  Diagnosis Date  . Diabetes mellitus  without complication (HCC)   . Hyperlipidemia   . Hypertension     Allergies Allergies  Allergen Reactions  . Acetaminophen     IV Location/Drains/Wounds Patient Lines/Drains/Airways Status    Active Line/Drains/Airways    Name Placement date Placement time Site Days   Peripheral IV 11/12/20 20 G Left;Upper Arm 11/12/20  1730  Arm  1   Peripheral IV 11/12/20 20 G Right Antecubital 11/12/20  1733  Antecubital  1          Labs/Imaging Results for orders placed or performed during the hospital encounter of 11/12/20 (from the past 48 hour(s))  Comprehensive metabolic panel     Status: Abnormal   Collection Time: 11/12/20  3:02 PM  Result Value Ref Range   Sodium 130 (L) 135 - 145 mmol/L   Potassium 3.7 3.5 - 5.1 mmol/L   Chloride 94 (L) 98 - 111 mmol/L   CO2 28 22 - 32 mmol/L   Glucose, Bld 197 (H) 70 - 99 mg/dL    Comment: Glucose reference range applies only to samples taken after fasting for at least 8 hours.   BUN 15 8 - 23 mg/dL   Creatinine, Ser 4.01 0.61 - 1.24 mg/dL   Calcium 8.9 8.9 - 02.7 mg/dL   Total Protein 7.6 6.5 - 8.1 g/dL   Albumin 3.9 3.5 - 5.0 g/dL  AST 16 15 - 41 U/L   ALT 12 0 - 44 U/L   Alkaline Phosphatase 36 (L) 38 - 126 U/L   Total Bilirubin 1.2 0.3 - 1.2 mg/dL   GFR, Estimated >19 >37 mL/min    Comment: (NOTE) Calculated using the CKD-EPI Creatinine Equation (2021)    Anion gap 8 5 - 15    Comment: Performed at [facility], [address] [place], [place] [address]  CBC with Differential     Status: Abnormal   Collection Time: 11/12/20  3:02 PM  Result Value Ref Range   WBC 11.3 (H) 4.0 - 10.5 K/uL   RBC 4.21 (L) 4.22 - 5.81 MIL/uL   Hemoglobin 13.3 13.0 - 17.0 g/dL   HCT 97.3 53.2 - 99.2 %   MCV 92.9 80.0 - 100.0 fL   MCH 31.6 26.0 - 34.0 pg   MCHC 34.0 30.0 - 36.0 g/dL   RDW 42.6 83.4 - 19.6 %   Platelets 229 150 - 400 K/uL   nRBC 0.0 0.0 - 0.2 %   Neutrophils Relative % 88 %   Neutro Abs 9.9 (H) 1.7 - 7.7  K/uL   Lymphocytes Relative 4 %   Lymphs Abs 0.5 (L) 0.7 - 4.0 K/uL   Monocytes Relative 8 %   Monocytes Absolute 0.9 0.1 - 1.0 K/uL   Eosinophils Relative 0 %   Eosinophils Absolute 0.0 0.0 - 0.5 K/uL   Basophils Relative 0 %   Basophils Absolute 0.0 0.0 - 0.1 K/uL   Immature Granulocytes 0 %   Abs Immature Granulocytes 0.03 0.00 - 0.07 K/uL    Comment: Performed at [facility], [address] [place], [place] [address]  Lactic acid, plasma     Status: None   Collection Time: 11/12/20  3:02 PM  Result Value Ref Range   Lactic Acid, Venous 1.2 0.5 - 1.9 mmol/L    Comment: Performed at [facility], [address] [place], [place] [address]  Urinalysis, Routine w reflex microscopic Urine, Clean Catch     Status: Abnormal   Collection Time: 11/12/20  4:09 PM  Result Value Ref Range   Color, Urine YELLOW YELLOW   APPearance CLEAR CLEAR   Specific Gravity, Urine 1.015 1.005 - 1.030   pH 6.0 5.0 - 8.0   Glucose, UA NEGATIVE NEGATIVE mg/dL   Hgb urine dipstick SMALL (A) NEGATIVE   Bilirubin Urine NEGATIVE NEGATIVE   Ketones, ur 5 (A) NEGATIVE mg/dL   Protein, ur 30 (A) NEGATIVE mg/dL   Nitrite NEGATIVE NEGATIVE   Leukocytes,Ua NEGATIVE NEGATIVE   RBC / HPF 0-5 0 - 5 RBC/hpf   WBC, UA 0-5 0 - 5 WBC/hpf   Bacteria, UA NONE SEEN NONE SEEN    Comment: Performed at [facility], [address], [place], [place] [address]  Resp Panel by RT-PCR (Flu A&B, Covid) Nasopharyngeal Swab     Status: None   Collection Time: 11/12/20  4:12 PM   Specimen: Nasopharyngeal Swab; Nasopharyngeal(NP) swabs in vial transport medium  Result Value Ref Range   SARS Coronavirus 2 by RT PCR NEGATIVE NEGATIVE    Comment: (NOTE) SARS-CoV-2 target nucleic acids are NOT DETECTED.  The SARS-CoV-2 RNA is generally detectable in upper respiratory specimens during the acute phase of infection. The lowest concentration of SARS-CoV-2 viral copies this assay  can detect is 138 copies/mL. A negative result does not preclude SARS-Cov-2 infection and should not be used as the  sole basis for treatment or other patient management decisions. A negative result may occur with  improper specimen collection/handling, submission of specimen other than nasopharyngeal swab, presence of viral mutation(s) within the areas targeted by this assay, and inadequate number of viral copies(<138 copies/mL). A negative result must be combined with clinical observations, patient history, and epidemiological information. The expected result is Negative.  Fact Sheet for Patients:  BloggerCourse.comhttps://www.fda.gov/media/152166/download  Fact Sheet for Healthcare Providers:  SeriousBroker.ithttps://www.fda.gov/media/152162/download  This test is no t yet approved or cleared by the [place] FDA and  has been authorized for detection and/or diagnosis of SARS-CoV-2 by FDA under an Emergency Use Authorization (EUA). This EUA will remain  in effect (meaning this test can be used) for the duration of the COVID-19 declaration under Section 564(b)(1) of the Act, 21 U.S.C.section 360bbb-3(b)(1), unless the authorization is terminated  or revoked sooner.       Influenza A by PCR NEGATIVE NEGATIVE   Influenza B by PCR NEGATIVE NEGATIVE    Comment: (NOTE) The Xpert Xpress SARS-CoV-2/FLU/RSV plus assay is intended as an aid in the diagnosis of influenza from Nasopharyngeal swab specimens and should not be used as a sole basis for treatment. Nasal washings and aspirates are unacceptable for Xpert Xpress SARS-CoV-2/FLU/RSV testing.  Fact Sheet for Patients: BloggerCourse.comhttps://www.fda.gov/media/152166/download  Fact Sheet for Healthcare Providers: SeriousBroker.ithttps://www.fda.gov/media/152162/download  This test is not yet approved or cleared by the [place] FDA and has been authorized for detection and/or diagnosis of SARS-CoV-2 by FDA under an Emergency Use Authorization (EUA). This EUA will remain in effect  (meaning this test can be used) for the duration of the COVID-19 declaration under Section 564(b)(1) of the Act, 21 U.S.C. section 360bbb-3(b)(1), unless the authorization is terminated or revoked.  Performed at [facility], [address] [place], [place] [address]   CBC with Differential     Status: Abnormal   Collection Time: 11/12/20  8:30 PM  Result Value Ref Range   WBC 10.1 4.0 - 10.5 K/uL   RBC 4.02 (L) 4.22 - 5.81 MIL/uL   Hemoglobin 12.8 (L) 13.0 - 17.0 g/dL   HCT 19.137.4 (L) 47.839.0 - 29.552.0 %   MCV 93.0 80.0 - 100.0 fL   MCH 31.8 26.0 - 34.0 pg   MCHC 34.2 30.0 - 36.0 g/dL   RDW 62.112.9 30.811.5 - 65.715.5 %   Platelets 218 150 - 400 K/uL   nRBC 0.0 0.0 - 0.2 %   Neutrophils Relative % 87 %   Neutro Abs 8.8 (H) 1.7 - 7.7 K/uL   Lymphocytes Relative 6 %   Lymphs Abs 0.6 (L) 0.7 - 4.0 K/uL   Monocytes Relative 6 %   Monocytes Absolute 0.6 0.1 - 1.0 K/uL   Eosinophils Relative 0 %   Eosinophils Absolute 0.0 0.0 - 0.5 K/uL   Basophils Relative 0 %   Basophils Absolute 0.0 0.0 - 0.1 K/uL   Immature Granulocytes 1 %   Abs Immature Granulocytes 0.05 0.00 - 0.07 K/uL    Comment: Performed at [facility], [address] [place], [place] [address]  Lactic acid, plasma     Status: None   Collection Time: 11/12/20  8:30 PM  Result Value Ref Range   Lactic Acid, Venous 1.2 0.5 - 1.9 mmol/L    Comment: Performed at [facility], [address] [place], [place] [address]  Protime-INR     Status: None   Collection Time: 11/12/20  8:30 PM  Result Value  Ref Range   Prothrombin Time 14.4 11.4 - 15.2 seconds   INR 1.1 0.8 - 1.2    Comment: (NOTE) INR goal varies based on device and disease states. Performed at [facility], [address], [place], [place] [address]   APTT     Status: None   Collection Time: 11/12/20  8:30 PM  Result Value Ref Range   aPTT 31 24 - 36 seconds    Comment: Performed at [facility], [address], [place], [place] [address]  Cortisol     Status: None   Collection Time: 11/12/20  8:30 PM  Result Value Ref Range   Cortisol, Plasma 18.4 ug/dL    Comment: (NOTE) AM    6.7 - 22.6 ug/dL PM   <28.4       ug/dL Performed at [facility], [address] [place], [place] [address]   CBG monitoring, ED     Status: Abnormal   Collection Time: 11/12/20 10:38 PM  Result Value Ref Range   Glucose-Capillary 145 (H) 70 - 99 mg/dL    Comment: Glucose reference range applies only to samples taken after fasting for at least 8 hours.   DG Chest 2 View  Result Date: 11/12/2020 CLINICAL DATA:  Fever for the past 2 days. EXAM: CHEST - 2 VIEW COMPARISON:  None. FINDINGS: There is a small focus of airspace opacity in the right lung base worrisome for pneumonia. The left lung is clear. No pneumothorax or pleural effusion. Heart size is normal. Aortic atherosclerosis noted. IMPRESSION: Small focus right basilar airspace opacity worrisome for pneumonia. Aortic Atherosclerosis (ICD10-I70.0). Electronically Signed   By: Drusilla Kanner M.D.   On: 11/12/2020 15:37    Pending Labs Unresulted Labs (From admission, onward)    Start     Ordered   11/13/20 0500  Basic metabolic panel  Tomorrow morning,   R        11/12/20 2009   11/13/20 0500  CBC  Tomorrow morning,   R        11/12/20 2009   11/12/20 2130  Hemoglobin A1c  Once,   STAT       Comments: To assess prior glycemic control    11/12/20 2130   11/12/20 2010  Lactic acid, plasma  STAT Now then every 2 hours,   STAT      11/12/20 2010   11/12/20 1544  Blood culture (routine x 2)  BLOOD CULTURE X 2,   STAT      11/12/20 1543          Vitals/Pain Today's Vitals   11/12/20 2011 11/12/20 2229 11/12/20 2231 11/12/20 2331  BP:   117/83   Pulse:   96   Resp:   (!) 23   Temp: (!) 100.7 F (38.2 C) (!) 100.6 F (38.1 C)  (!) 102.2 F (39 C)  TempSrc: Rectal Oral  Oral  SpO2:   100%   Weight:       Height:      PainSc:        Isolation Precautions No active isolations  Medications Medications  amLODipine (NORVASC) tablet 5 mg (5 mg Oral Given 11/12/20 2230)  hydrochlorothiazide (HYDRODIURIL) tablet 25 mg (25 mg Oral Given 11/12/20 2230)  enoxaparin (LOVENOX) injection 40 mg (40 mg Subcutaneous Given 11/12/20 2230)  traZODone (DESYREL) tablet 25 mg (has no administration in time range)  magnesium hydroxide (MILK OF MAGNESIA) suspension 30 mL (has no administration in  time range)  ondansetron (ZOFRAN) tablet 4 mg (has no administration in time range)    Or  ondansetron (ZOFRAN) injection 4 mg (has no administration in time range)  cefTRIAXone (ROCEPHIN) 2 g in sodium chloride 0.9 % 100 mL IVPB (has no administration in time range)  0.9 %  sodium chloride infusion ( Intravenous New Bag/Given 11/12/20 2332)  azithromycin (ZITHROMAX) 500 mg in sodium chloride 0.9 % 250 mL IVPB (has no administration in time range)  pravastatin (PRAVACHOL) tablet 10 mg (10 mg Oral Given 11/12/20 2229)  insulin aspart (novoLOG) injection 0-9 Units (0 Units Subcutaneous Not Given 11/12/20 2238)  ibuprofen (ADVIL) tablet 400 mg (400 mg Oral Given 11/12/20 2332)  sodium chloride 0.9 % bolus 1,000 mL (0 mLs Intravenous Stopped 11/12/20 1901)  acetaminophen (TYLENOL) tablet 650 mg (650 mg Oral Given 11/12/20 1731)  cefTRIAXone (ROCEPHIN) 1 g in sodium chloride 0.9 % 100 mL IVPB (0 g Intravenous Stopped 11/12/20 1926)  azithromycin (ZITHROMAX) 500 mg in sodium chloride 0.9 % 250 mL IVPB (0 mg Intravenous Stopped 11/12/20 2000)    Mobility walks

## 2020-11-13 NOTE — Plan of Care (Signed)
Pt slept well through the night; he is all smile this morning and very nice; no s/s of acute distress or pain reported or observed; call light within reach and bed at lowest position for safety.

## 2020-11-14 LAB — COMPREHENSIVE METABOLIC PANEL
ALT: 25 U/L (ref 0–44)
AST: 33 U/L (ref 15–41)
Albumin: 2.9 g/dL — ABNORMAL LOW (ref 3.5–5.0)
Alkaline Phosphatase: 31 U/L — ABNORMAL LOW (ref 38–126)
Anion gap: 7 (ref 5–15)
BUN: 12 mg/dL (ref 8–23)
CO2: 25 mmol/L (ref 22–32)
Calcium: 8.1 mg/dL — ABNORMAL LOW (ref 8.9–10.3)
Chloride: 104 mmol/L (ref 98–111)
Creatinine, Ser: 0.68 mg/dL (ref 0.61–1.24)
GFR, Estimated: >60 mL/min (ref >60)
Glucose, Bld: 147 mg/dL — ABNORMAL HIGH (ref 70–99)
Potassium: 3 mmol/L — ABNORMAL LOW (ref 3.5–5.1)
Sodium: 136 mmol/L (ref 135–145)
Total Bilirubin: 0.3 mg/dL (ref 0.3–1.2)
Total Protein: 6.5 g/dL (ref 6.5–8.1)

## 2020-11-14 LAB — CBC WITH DIFFERENTIAL/PLATELET
Abs Immature Granulocytes: 0.02 10*3/uL (ref 0.00–0.07)
Basophils Absolute: 0 10*3/uL (ref 0.0–0.1)
Basophils Relative: 0 %
Eosinophils Absolute: 0 10*3/uL (ref 0.0–0.5)
Eosinophils Relative: 1 %
HCT: 37.3 % — ABNORMAL LOW (ref 39.0–52.0)
Hemoglobin: 12.6 g/dL — ABNORMAL LOW (ref 13.0–17.0)
Immature Granulocytes: 0 %
Lymphocytes Relative: 6 %
Lymphs Abs: 0.5 10*3/uL — ABNORMAL LOW (ref 0.7–4.0)
MCH: 31 pg (ref 26.0–34.0)
MCHC: 33.8 g/dL (ref 30.0–36.0)
MCV: 91.9 fL (ref 80.0–100.0)
Monocytes Absolute: 0.6 10*3/uL (ref 0.1–1.0)
Monocytes Relative: 8 %
Neutro Abs: 6.9 10*3/uL (ref 1.7–7.7)
Neutrophils Relative %: 85 %
Platelets: 204 10*3/uL (ref 150–400)
RBC: 4.06 MIL/uL — ABNORMAL LOW (ref 4.22–5.81)
RDW: 13 % (ref 11.5–15.5)
WBC: 8.1 10*3/uL (ref 4.0–10.5)
nRBC: 0 % (ref 0.0–0.2)

## 2020-11-14 LAB — PHOSPHORUS: Phosphorus: 1.8 mg/dL — ABNORMAL LOW (ref 2.5–4.6)

## 2020-11-14 LAB — HEMOGLOBIN A1C
Hgb A1c MFr Bld: 6.7 % — ABNORMAL HIGH (ref 4.8–5.6)
Mean Plasma Glucose: 146 mg/dL

## 2020-11-14 LAB — GLUCOSE, CAPILLARY
Glucose-Capillary: 145 mg/dL — ABNORMAL HIGH (ref 70–99)
Glucose-Capillary: 153 mg/dL — ABNORMAL HIGH (ref 70–99)
Glucose-Capillary: 136 mg/dL — ABNORMAL HIGH (ref 70–99)
Glucose-Capillary: 234 mg/dL — ABNORMAL HIGH (ref 70–99)

## 2020-11-14 LAB — MAGNESIUM: Magnesium: 2 mg/dL (ref 1.7–2.4)

## 2020-11-14 LAB — STREP PNEUMONIAE URINARY ANTIGEN: Strep Pneumo Urinary Antigen: NEGATIVE

## 2020-11-14 MED ORDER — POTASSIUM CHLORIDE CRYS ER 20 MEQ PO TBCR
40.0000 meq | EXTENDED_RELEASE_TABLET | ORAL | Status: AC
  Administered 2020-11-14 (×2): 40 meq via ORAL
  Filled 2020-11-14 (×2): qty 2

## 2020-11-14 MED ORDER — AZITHROMYCIN 250 MG PO TABS
500.0000 mg | ORAL_TABLET | Freq: Every day | ORAL | Status: DC
  Administered 2020-11-14 – 2020-11-16 (×3): 500 mg via ORAL
  Filled 2020-11-14 (×3): qty 2

## 2020-11-14 NOTE — Progress Notes (Signed)
PHARMACIST - PHYSICIAN COMMUNICATION  CONCERNING: Antibiotic IV to Oral Route Change Policy  RECOMMENDATION: This patient is receiving azithromycin by the intravenous route.  Based on criteria approved by the Pharmacy and Therapeutics Committee, the antibiotic(s) is/are being converted to the equivalent oral dose form(s).   DESCRIPTION: These criteria include:  Patient being treated for a respiratory tract infection, urinary tract infection, cellulitis or clostridium difficile associated diarrhea if on metronidazole  The patient is not neutropenic and does not exhibit a GI malabsorption state  The patient is eating (either orally or via tube) and/or has been taking other orally administered medications for a least 24 hours  The patient is improving clinically and has a Tmax < 100.5  If you have questions about this conversion, please contact the Pharmacy Department  []  ( [phone] )  [place] []  ( [phone]  [facility] []  ( [phone]  [place] []  ( [phone]  [facility] [x]  ( [phone] )  [facility]  

## 2020-11-14 NOTE — Progress Notes (Signed)
PROGRESS NOTE    Timothy Reilly  PNT:[id number] DOB: 1932/07/09 DOA: 11/12/2020 PCP: Merlene Laughter, MD   Chief Complaint  Patient presents with   Fever   Fatigue    Brief Narrative:  Timothy Reilly is Timothy Reilly 87 y.o. male with medical history significant for type 2 diabetes mellitus, hypertension and dyslipidemia, who presented to the emergency room with acute onset of fever and generalized fatigue and tiredness today.  The patient is usually fairly active and is often in his garden and exercises regularly.  His T-max was up to 102.  He took Tylenol and cold compresses were applied to his forehead.  He states she came down below 101 and then rebounded back to 102.  His blood glucose level has been elevated over the last 24 hours.  He is fully vaccinated for COVID-19.  No recent sick exposure.  He denies any chest pain or dyspnea or cough or wheezing.  No neck pain or stiffness.  No headache or dizziness or blurred vision.  Denies any nausea or vomiting or abdominal pain.  No dysuria urinary frequency urgency or oliguria or flank pain.   ED Course: Temperature was 101.1 and later 104.3 and heart rate 95 with otherwise normal vital signs.Labs revealed hyponatremia hypochloremia and hyperglycemia.  Lactic acid was 1.2 and CBC showed WBC of 11.   Imaging: Two-view chest x-ray showed small focus right basal airspace opacity concerning for pneumonia.  The patient was given 650 mg p.o. Tylenol, IV Rocephin and Zithromax and 1 L bolus of IV normal saline.  He will be admitted [facility] monitored [facility] for further evaluation and management.  Assessment & Plan:   Active Problems:   CAP (community acquired pneumonia)   1.  Sepsis 2/2 Community Acquired Pneumonia .  This is manifested by high fever and mild tachycardia. - ceftriaxone, azithromycin (6/14-pending) -- sputum cx (pending collection), urine strep (negative), urine legionella (pending) -- SLP eval -- blood cx pending -- CT chest with right lower  lobe pneumonia - continuing to have subjective fevers and chills despite scheduled APAP, ibuprofen prn.  Fever curve overall improved.  If he continues to be symptomatic despite appropriate therapy for CAP, reasonable to broaden coverage and/or pulm consult.    2.  Hyponatremia and hypochloremia likely hypovolemic. - improved  3.  Type 2 diabetes mellitus. - The patient will be placed on supplement coverage with NovoLog. - We will hold off metformin.  4.  Dyslipidemia. - We will continue statin therapy.  5.  Essential hypertension. - We will continue Norvasc and hold off HCTZ..  Mild ectasia of ascending aorta Follow outpatient  DVT prophylaxis: lovenox Code Status: DNR Family Communication: wife at bedside Disposition:   Status is: Inpatient  Remains inpatient appropriate because:Inpatient level of care appropriate due to severity of illness  Dispo: The patient is from: Home              Anticipated d/c is to: Home              Patient currently is not medically stable to d/c.   Difficult to place patient No       Consultants:  none  Procedures:  none  Antimicrobials:  Anti-infectives (From admission, onward)    Start     Dose/Rate Route Frequency Ordered Stop   11/14/20 1800  azithromycin (ZITHROMAX) tablet 500 mg        500 mg Oral Daily-1800 11/14/20 1024     11/13/20 1800  cefTRIAXone (ROCEPHIN) 2 g in sodium chloride 0.9 % 100 mL IVPB        2 g 200 mL/hr over 30 Minutes Intravenous Every 24 hours 11/12/20 2010     11/13/20 1800  azithromycin (ZITHROMAX) 500 mg in sodium chloride 0.9 % 250 mL IVPB  Status:  Discontinued        500 mg 250 mL/hr over 60 Minutes Intravenous Every 24 hours 11/12/20 2014 11/14/20 1024   11/12/20 1845  cefTRIAXone (ROCEPHIN) 1 g in sodium chloride 0.9 % 100 mL IVPB        1 g 200 mL/hr over 30 Minutes Intravenous  Once 11/12/20 1840 11/12/20 1926   11/12/20 1845  azithromycin (ZITHROMAX) 500 mg in sodium chloride 0.9 % 250 mL  IVPB        500 mg 250 mL/hr over 60 Minutes Intravenous  Once 11/12/20 1840 11/12/20 2000   11/12/20 1800  azithromycin (ZITHROMAX) 500 mg in sodium chloride 0.9 % 250 mL IVPB  Status:  Discontinued        500 mg 250 mL/hr over 60 Minutes Intravenous Every 24 hours 11/12/20 2010 11/12/20 2014          Subjective: Complaining of chills  Objective: Vitals:   11/13/20 1450 11/13/20 2202 11/14/20 0450 11/14/20 1157  BP: (!) 105/94 96/62 103/62   Pulse: 87 60 71   Resp:  17 17   Temp:  (!) 97.4 F (36.3 C) 98.3 F (36.8 C) 98.7 F (37.1 C)  TempSrc:  Oral Oral Axillary  SpO2:  97% 97%   Weight:      Height:        Intake/Output Summary (Last 24 hours) at 11/14/2020 1316 Last data filed at 11/14/2020 0654 Gross per 24 hour  Intake 1550.06 ml  Output 300 ml  Net 1250.06 ml   Filed Weights   11/12/20 1449 11/13/20 0615  Weight: 57.2 kg 55.4 kg    Examination:  General: No acute distress. Cardiovascular: Heart sounds show Elinor Kleine regular rate, and rhythm.  Lungs: R basiliar crackles  Abdomen: Soft, nontender, nondistended Neurological: Alert and oriented 3. Moves all extremities 4. Cranial nerves II through XII grossly intact. Skin: Warm and dry. No rashes or lesions. Extremities: No clubbing or cyanosis. No edema.   Data Reviewed: I have personally reviewed following labs and imaging studies  CBC: Recent Labs  Lab 11/12/20 1502 11/12/20 2030 11/13/20 0534 11/14/20 0520  WBC 11.3* 10.1 9.0 8.1  NEUTROABS 9.9* 8.8*  --  6.9  HGB 13.3 12.8* 12.8* 12.6*  HCT 39.1 37.4* 39.4 37.3*  MCV 92.9 93.0 94.0 91.9  PLT 229 218 193 204    Basic Metabolic Panel: Recent Labs  Lab 11/12/20 1502 11/13/20 0534 11/13/20 2210 11/14/20 0520  NA 130* 137  --  136  K 3.7 3.6  --  3.0*  CL 94* 101  --  104  CO2 28 28  --  25  GLUCOSE 197* 138*  --  147*  BUN 15 12  --  12  CREATININE 0.91 0.88  --  0.68  CALCIUM 8.9 8.7*  --  8.1*  MG  --   --   --  2.0  PHOS  --   --   2.7  --     GFR: Estimated Creatinine Clearance: 51.9 mL/min (by C-G formula based on SCr of 0.68 mg/dL).  Liver Function Tests: Recent Labs  Lab 11/12/20 1502 11/14/20 0520  AST 16 33  ALT 12 25  ALKPHOS 36* 31*  BILITOT 1.2 0.3  PROT 7.6 6.5  ALBUMIN 3.9 2.9*    CBG: Recent Labs  Lab 11/13/20 1203 11/13/20 1712 11/13/20 2159 11/14/20 0803 11/14/20 1207  GLUCAP 162* 169* 171* 145* 153*     Recent Results (from the past 240 hour(s))  Resp Panel by RT-PCR (Flu Takiya Belmares&B, Covid) Nasopharyngeal Swab     Status: None   Collection Time: 11/12/20  4:12 PM   Specimen: Nasopharyngeal Swab; Nasopharyngeal(NP) swabs in vial transport medium  Result Value Ref Range Status   SARS Coronavirus 2 by RT PCR NEGATIVE NEGATIVE Final    Comment: (NOTE) SARS-CoV-2 target nucleic acids are NOT DETECTED.  The SARS-CoV-2 RNA is generally detectable in upper respiratory specimens during the acute phase of infection. The lowest concentration of SARS-CoV-2 viral copies this assay can detect is 138 copies/mL. Meeah Totino negative result does not preclude SARS-Cov-2 infection and should not be used as the sole basis for treatment or other patient management decisions. Maaliyah Adolph negative result may occur with  improper specimen collection/handling, submission of specimen other than nasopharyngeal swab, presence of viral mutation(s) within the areas targeted by this assay, and inadequate number of viral copies(<138 copies/mL). Charlyn Vialpando negative result must be combined with clinical observations, patient history, and epidemiological information. The expected result is Negative.  Fact Sheet for Patients:  BloggerCourse.comhttps://www.fda.gov/media/152166/download  Fact Sheet for Healthcare Providers:  SeriousBroker.ithttps://www.fda.gov/media/152162/download  This test is no t yet approved or cleared by the [place] FDA and  has been authorized for detection and/or diagnosis of SARS-CoV-2 by FDA under an Emergency Use Authorization (EUA). This  EUA will remain  in effect (meaning this test can be used) for the duration of the COVID-19 declaration under Section 564(b)(1) of the Act, 21 U.S.C.section 360bbb-3(b)(1), unless the authorization is terminated  or revoked sooner.       Influenza Kael Keetch by PCR NEGATIVE NEGATIVE Final   Influenza B by PCR NEGATIVE NEGATIVE Final    Comment: (NOTE) The Xpert Xpress SARS-CoV-2/FLU/RSV plus assay is intended as an aid in the diagnosis of influenza from Nasopharyngeal swab specimens and should not be used as Merida Alcantar sole basis for treatment. Nasal washings and aspirates are unacceptable for Xpert Xpress SARS-CoV-2/FLU/RSV testing.  Fact Sheet for Patients: BloggerCourse.comhttps://www.fda.gov/media/152166/download  Fact Sheet for Healthcare Providers: SeriousBroker.ithttps://www.fda.gov/media/152162/download  This test is not yet approved or cleared by the [place] FDA and has been authorized for detection and/or diagnosis of SARS-CoV-2 by FDA under an Emergency Use Authorization (EUA). This EUA will remain in effect (meaning this test can be used) for the duration of the COVID-19 declaration under Section 564(b)(1) of the Act, 21 U.S.C. section 360bbb-3(b)(1), unless the authorization is terminated or revoked.  Performed at [facility], [address] [place], [place] [address]   Blood culture (routine x 2)     Status: None (Preliminary result)   Collection Time: 11/12/20  5:30 PM   Specimen: BLOOD  Result Value Ref Range Status   Specimen Description   Final    BLOOD RIGHT ANTECUBITAL Performed at [facility], [address] [place], [place] [address]    Special Requests   Final    BOTTLES DRAWN AEROBIC AND ANAEROBIC Blood Culture adequate volume Performed at [facility], [address] [place], [place] [address]    Culture   Final    NO GROWTH 2 DAYS Performed at [facility], [address] [place], [place] [address]    Report Status  PENDING  Incomplete  Blood culture (routine x 2)     Status: None (Preliminary result)   Collection Time: 11/12/20  5:44 PM   Specimen: BLOOD LEFT ARM  Result Value Ref Range Status   Specimen Description   Final    BLOOD LEFT ARM Performed at [facility], [address] [place], [place] [address]    Special Requests   Final    BOTTLES DRAWN AEROBIC AND ANAEROBIC Blood Culture adequate volume Performed at [facility], [address], [place], [place] [address]    Culture   Final    NO GROWTH 2 DAYS Performed at [facility], [address] [place], [place] [address]    Report Status PENDING  Incomplete         Radiology Studies: DG Chest 2 View  Result Date: 11/12/2020 CLINICAL DATA:  Fever for the past 2 days. EXAM: CHEST - 2 VIEW COMPARISON:  None. FINDINGS: There is Boubacar Lerette small focus of airspace opacity in the right lung base worrisome for pneumonia. The left lung is clear. No pneumothorax or pleural effusion. Heart size is normal. Aortic atherosclerosis noted. IMPRESSION: Small focus right basilar airspace opacity worrisome for pneumonia. Aortic Atherosclerosis (ICD10-I70.0). Electronically Signed   By: Drusilla Kanner M.D.   On: 11/12/2020 15:37   CT CHEST WO CONTRAST  Result Date: 11/13/2020 CLINICAL DATA:  Respiratory failure. EXAM: CT CHEST WITHOUT CONTRAST TECHNIQUE: Multidetector CT imaging of the chest was performed following the standard protocol without IV contrast. COMPARISON:  Chest radiograph November 12, 2020 FINDINGS: Cardiovascular: Normal heart size. Calcific atherosclerotic disease of the coronary arteries and aorta. Mild ectasia of the ascending aorta measuring 3.8 cm. Tortuosity of the aorta. Mediastinum/Nodes: No enlarged mediastinal or axillary lymph nodes. Thyroid gland, trachea, and esophagus demonstrate no significant findings. Lungs/Pleura: Dense airspace consolidation in the right lower lobe. No evidence of pleural  effusion or pneumothorax. Upper Abdomen: No acute abnormality. Musculoskeletal: No chest wall mass or suspicious bone lesions identified. Spondylosis of the spine. IMPRESSION: 1. Right lower lobe pneumonia. 2. Calcific atherosclerotic disease of the coronary arteries and aorta. 3. Mild ectasia of the ascending aorta measuring 3.8 cm. Aortic Atherosclerosis (ICD10-I70.0). Electronically Signed   By: Ted Mcalpine M.D.   On: 11/13/2020 16:51        Scheduled Meds:  acetaminophen  1,000 mg Oral Q8H   amLODipine  5 mg Oral Daily   azithromycin  500 mg Oral q1800   enoxaparin (LOVENOX) injection  40 mg Subcutaneous Q24H   insulin aspart  0-9 Units Subcutaneous TID AC & HS   potassium chloride  40 mEq Oral Q4H   pravastatin  10 mg Oral Daily   Continuous Infusions:  sodium chloride 100 mL/hr at 11/14/20 1035   cefTRIAXone (ROCEPHIN)  IV Stopped (11/13/20 1714)     LOS: 2 days    Time spent: over 30 min    Lacretia Nicks, MD [facility]   To contact the attending provider between 7A-7P or the covering provider during after hours 7P-7A, please log into the web site [email or web address] and access using universal [place] password for that web site. If you do not have the password, please call the hospital operator.  11/14/2020, 1:16 PM

## 2020-11-14 NOTE — Evaluation (Addendum)
Clinical/Bedside Swallow Evaluation Patient Details  Name: Timothy Reilly MRN: [id number] Date of Birth: [birth year]  Today's Date: 11/14/2020 Time: SLP Start Time (ACUTE ONLY): 1742 SLP Stop Time (ACUTE ONLY): 1755 SLP Time Calculation (min) (ACUTE ONLY): 13 min  Past Medical History:  Past Medical History:  Diagnosis Date   Diabetes mellitus without complication (HCC)    Hyperlipidemia    Hypertension    Past Surgical History:  Past Surgical History:  Procedure Laterality Date   EYE SURGERY     HPI:  Per MD notes, "Timothy Reilly is a 87 y.o. male with medical history significant for type 2 diabetes mellitus, hypertension and dyslipidemia, who presented to the emergency room with acute onset of fever and generalized fatigue and tiredness today.  The patient is usually fairly active and is often in his garden and exercises regularly.  His temperature continues to elevate.  Chest imaging showed right lung pna.  Swallow evaluation ordered. Pt and his wife deny pt having any indication of dysphagia and no signficant neurological hx noted.   Assessment / Plan / Recommendation Clinical Impression  Pt presents with normal clinical swallow evaluation. No focal CN deficits but he does appear with minimal facial asymmetry. From chart review, pt had Bell's palsy in 2018.  Pt easily passed 3 ounce Yale water screen, consumed solids and puree easily.  Swallow appeared swift via observation of laryngeal elevation and no oral retention present. Pt denies issues with any GERD or dysphagia.  Recommend regular/thin diet - No SLP follow up needed.  Thanks. SLP Visit Diagnosis: Dysphagia, unspecified (R13.10)    Aspiration Risk  No limitations    Diet Recommendation     Medication Administration: Whole meds with liquid Supervision: Patient able to self feed Compensations: Slow rate;Small sips/bites    Other  Recommendations Oral Care Recommendations: Oral care BID   Follow up Recommendations None       Frequency and Duration   N/a         Prognosis    N/a    Swallow Study   General Date of Onset: 11/14/20 HPI: Per MD notes, "Timothy Reilly is a 87 y.o. male with medical history significant for type 2 diabetes mellitus, hypertension and dyslipidemia, who presented to the emergency room with acute onset of fever and generalized fatigue and tiredness today.  The patient is usually fairly active and is often in his garden and exercises regularly.  His temperature continues to elevate.  Chest imaging showed right lung pna.  Swallow evaluation ordered. Pt and his wife deny pt having any indication of dysphagia and no signficant neurological hx noted. Type of Study: Bedside Swallow Evaluation Diet Prior to this Study: Regular;NPO Temperature Spikes Noted: No Respiratory Status: Room air History of Recent Intubation: No Behavior/Cognition: Alert;Cooperative;Pleasant mood Oral Cavity Assessment: Within Functional Limits Oral Care Completed by SLP: No Oral Cavity - Dentition: Adequate natural dentition (partial upper plate) Vision: Functional for self-feeding Self-Feeding Abilities: Able to feed self Patient Positioning: Upright in bed Baseline Vocal Quality: Normal Volitional Cough: Strong Volitional Swallow: Able to elicit    Oral/Motor/Sensory Function Overall Oral Motor/Sensory Function: Other (comment) (? minimal facial asymmetry)   Ice Chips Ice chips: Not tested   Thin Liquid Thin Liquid: Within functional limits Presentation: Self Fed;Straw    Nectar Thick Nectar Thick Liquid: Not tested   Honey Thick Honey Thick Liquid: Not tested   Puree Puree: Within functional limits Presentation: Self Fed;Spoon   Solid  Solid: Within functional limits Presentation: Self Fed;Spoon      Chales Abrahams 11/14/2020,8:19 PM  Rolena Infante, MS [facility] SLP Acute Rehab Services Office [phone] Pager [phone]

## 2020-11-15 LAB — CBC WITH DIFFERENTIAL/PLATELET
Abs Immature Granulocytes: 0.03 10*3/uL (ref 0.00–0.07)
Basophils Absolute: 0 10*3/uL (ref 0.0–0.1)
Basophils Relative: 0 %
Eosinophils Absolute: 0 10*3/uL (ref 0.0–0.5)
Eosinophils Relative: 0 %
HCT: 36.2 % — ABNORMAL LOW (ref 39.0–52.0)
Hemoglobin: 12.4 g/dL — ABNORMAL LOW (ref 13.0–17.0)
Immature Granulocytes: 0 %
Lymphocytes Relative: 12 %
Lymphs Abs: 1 10*3/uL (ref 0.7–4.0)
MCH: 31.4 pg (ref 26.0–34.0)
MCHC: 34.3 g/dL (ref 30.0–36.0)
MCV: 91.6 fL (ref 80.0–100.0)
Monocytes Absolute: 0.6 10*3/uL (ref 0.1–1.0)
Monocytes Relative: 7 %
Neutro Abs: 6.5 10*3/uL (ref 1.7–7.7)
Neutrophils Relative %: 81 %
Platelets: 214 10*3/uL (ref 150–400)
RBC: 3.95 MIL/uL — ABNORMAL LOW (ref 4.22–5.81)
RDW: 13.2 % (ref 11.5–15.5)
WBC: 8.2 10*3/uL (ref 4.0–10.5)
nRBC: 0 % (ref 0.0–0.2)

## 2020-11-15 LAB — COMPREHENSIVE METABOLIC PANEL
ALT: 25 U/L (ref 0–44)
AST: 33 U/L (ref 15–41)
Albumin: 2.7 g/dL — ABNORMAL LOW (ref 3.5–5.0)
Alkaline Phosphatase: 35 U/L — ABNORMAL LOW (ref 38–126)
Anion gap: 10 (ref 5–15)
BUN: 8 mg/dL (ref 8–23)
CO2: 22 mmol/L (ref 22–32)
Calcium: 7.9 mg/dL — ABNORMAL LOW (ref 8.9–10.3)
Chloride: 106 mmol/L (ref 98–111)
Creatinine, Ser: 0.92 mg/dL (ref 0.61–1.24)
GFR, Estimated: >60 mL/min (ref >60)
Glucose, Bld: 128 mg/dL — ABNORMAL HIGH (ref 70–99)
Potassium: 3.2 mmol/L — ABNORMAL LOW (ref 3.5–5.1)
Sodium: 138 mmol/L (ref 135–145)
Total Bilirubin: 0.7 mg/dL (ref 0.3–1.2)
Total Protein: 6 g/dL — ABNORMAL LOW (ref 6.5–8.1)

## 2020-11-15 LAB — GLUCOSE, CAPILLARY
Glucose-Capillary: 124 mg/dL — ABNORMAL HIGH (ref 70–99)
Glucose-Capillary: 201 mg/dL — ABNORMAL HIGH (ref 70–99)
Glucose-Capillary: 242 mg/dL — ABNORMAL HIGH (ref 70–99)
Glucose-Capillary: 187 mg/dL — ABNORMAL HIGH (ref 70–99)

## 2020-11-15 LAB — LEGIONELLA PNEUMOPHILA SEROGP 1 UR AG: L. pneumophila Serogp 1 Ur Ag: NEGATIVE

## 2020-11-15 LAB — MAGNESIUM: Magnesium: 2.1 mg/dL (ref 1.7–2.4)

## 2020-11-15 LAB — C-REACTIVE PROTEIN: CRP: 15.6 mg/dL — ABNORMAL HIGH (ref <1.0)

## 2020-11-15 LAB — SEDIMENTATION RATE: Sed Rate: 90 mm/hr — ABNORMAL HIGH (ref 0–16)

## 2020-11-15 MED ORDER — POTASSIUM CHLORIDE CRYS ER 20 MEQ PO TBCR
40.0000 meq | EXTENDED_RELEASE_TABLET | ORAL | Status: AC
  Administered 2020-11-15 (×2): 40 meq via ORAL
  Filled 2020-11-15 (×2): qty 2

## 2020-11-15 NOTE — Progress Notes (Signed)
PROGRESS NOTE    Timothy Reilly  ZOX:[id number] DOB: 1932-07-31 DOA: 11/12/2020 PCP: Merlene LaughterStoneking, Hal, MD   Chief Complaint  Patient presents with   Fever   Fatigue    Brief Narrative:  Timothy Reilly is Timothy Reilly 87 y.o. male with medical history significant for type 2 diabetes mellitus, hypertension and dyslipidemia, who presented to the emergency room with acute onset of fever and generalized fatigue and tiredness today.  The patient is usually fairly active and is often in his garden and exercises regularly.  His T-max was up to 102.  He took Tylenol and cold compresses were applied to his forehead.  He states she came down below 101 and then rebounded back to 102.  His blood glucose level has been elevated over the last 24 hours.  He is fully vaccinated for COVID-19.  No recent sick exposure.  He denies any chest pain or dyspnea or cough or wheezing.  No neck pain or stiffness.  No headache or dizziness or blurred vision.  Denies any nausea or vomiting or abdominal pain.  No dysuria urinary frequency urgency or oliguria or flank pain.   ED Course: Temperature was 101.1 and later 104.3 and heart rate 95 with otherwise normal vital signs.Labs revealed hyponatremia hypochloremia and hyperglycemia.  Lactic acid was 1.2 and CBC showed WBC of 11.   Imaging: Two-view chest x-ray showed small focus right basal airspace opacity concerning for pneumonia.  The patient was given 650 mg p.o. Tylenol, IV Rocephin and Zithromax and 1 L bolus of IV normal saline.  He will be admitted to [facility] monitored bed for further evaluation and management.  Assessment & Plan:   Active Problems:   CAP (community acquired pneumonia)   1.  Sepsis 2/2 Community Acquired Pneumonia .  This is manifested by high fever and mild tachycardia. - ceftriaxone, azithromycin (6/14-pending) -- fever curve improving, last true fever 6/14.  Several elevated temperatures (suspect oral), last 99.9 6/17 am.   -- sputum cx (pending  collection), urine strep (negative), urine legionella (pending) -- SLP eval - no limitations -- blood cx NG -- CT chest with right lower lobe pneumonia - continue scheduled APAP, ibuprofen prn.  Fever curve overall improved.  If he continues to be symptomatic despite appropriate therapy for CAP, reasonable to broaden coverage and/or pulm consult.  Consider discharge in AM if he does well.  2.  Hyponatremia and hypochloremia likely hypovolemic. - improved  3.  Type 2 diabetes mellitus. - The patient will be placed on supplement coverage with NovoLog. - We will hold off metformin.  4.  Dyslipidemia. - We will continue statin therapy.  5.  Essential hypertension. - We will continue Norvasc and hold off HCTZ..  Mild ectasia of ascending aorta Follow outpatient  DVT prophylaxis: lovenox Code Status: DNR Family Communication: wife at bedside Disposition:   Status is: Inpatient  Remains inpatient appropriate because:Inpatient level of care appropriate due to severity of illness  Dispo: The patient is from: Home              Anticipated d/c is to: Home              Patient currently is not medically stable to d/c.   Difficult to place patient No       Consultants:  none  Procedures:  none  Antimicrobials:  Anti-infectives (From admission, onward)    Start     Dose/Rate Route Frequency Ordered Stop   11/14/20 1800  azithromycin (ZITHROMAX)  tablet 500 mg        500 mg Oral Daily-1800 11/14/20 1024     11/13/20 1800  cefTRIAXone (ROCEPHIN) 2 g in sodium chloride 0.9 % 100 mL IVPB        2 g 200 mL/hr over 30 Minutes Intravenous Every 24 hours 11/12/20 2010     11/13/20 1800  azithromycin (ZITHROMAX) 500 mg in sodium chloride 0.9 % 250 mL IVPB  Status:  Discontinued        500 mg 250 mL/hr over 60 Minutes Intravenous Every 24 hours 11/12/20 2014 11/14/20 1024   11/12/20 1845  cefTRIAXone (ROCEPHIN) 1 g in sodium chloride 0.9 % 100 mL IVPB        1 g 200 mL/hr over 30  Minutes Intravenous  Once 11/12/20 1840 11/12/20 1926   11/12/20 1845  azithromycin (ZITHROMAX) 500 mg in sodium chloride 0.9 % 250 mL IVPB        500 mg 250 mL/hr over 60 Minutes Intravenous  Once 11/12/20 1840 11/12/20 2000   11/12/20 1800  azithromycin (ZITHROMAX) 500 mg in sodium chloride 0.9 % 250 mL IVPB  Status:  Discontinued        500 mg 250 mL/hr over 60 Minutes Intravenous Every 24 hours 11/12/20 2010 11/12/20 2014          Subjective: No new complaints Sleeping well this mornign, no clear sx despite temp 99.9 this morning  Discussed possible d/c in morning if continuing to do well  Objective: Vitals:   11/14/20 2107 11/15/20 0612 11/15/20 0823 11/15/20 1428  BP: 139/73 106/60 115/72 125/79  Pulse: 93 79  76  Resp: 16 16  18   Temp: 99.1 F (37.3 C) 99.9 F (37.7 C)  98.3 F (36.8 C)  TempSrc: Oral Oral  Oral  SpO2: 99% 93%  96%  Weight:      Height:       No intake or output data in the 24 hours ending 11/15/20 1513  Filed Weights   11/12/20 1449 11/13/20 0615  Weight: 57.2 kg 55.4 kg    Examination:  General: No acute distress. Cardiovascular: Heart sounds show Gilman Olazabal regular rate, and rhythm Lungs: R basilar crackles Abdomen: Soft, nontender, nondistended with normal active bowel sounds. No masses. No hepatosplenomegaly. Neurological: Alert and oriented 3. Moves all extremities 4 . Cranial nerves II through XII grossly intact. Skin: Warm and dry. No rashes or lesions. Extremities: No clubbing or cyanosis. No edema.    Data Reviewed: I have personally reviewed following labs and imaging studies  CBC: Recent Labs  Lab 11/12/20 1502 11/12/20 2030 11/13/20 0534 11/14/20 0520 11/15/20 0533  WBC 11.3* 10.1 9.0 8.1 8.2  NEUTROABS 9.9* 8.8*  --  6.9 6.5  HGB 13.3 12.8* 12.8* 12.6* 12.4*  HCT 39.1 37.4* 39.4 37.3* 36.2*  MCV 92.9 93.0 94.0 91.9 91.6  PLT 229 218 193 204 214    Basic Metabolic Panel: Recent Labs  Lab 11/12/20 1502 11/13/20 0534  11/13/20 2210 11/14/20 0520 11/15/20 0533  NA 130* 137  --  136 138  K 3.7 3.6  --  3.0* 3.2*  CL 94* 101  --  104 106  CO2 28 28  --  25 22  GLUCOSE 197* 138*  --  147* 128*  BUN 15 12  --  12 8  CREATININE 0.91 0.88  --  0.68 0.92  CALCIUM 8.9 8.7*  --  8.1* 7.9*  MG  --   --   --  2.0 2.1  PHOS  --   --  2.7 1.8*  --     GFR: Estimated Creatinine Clearance: 45.2 mL/min (by C-G formula based on SCr of 0.92 mg/dL).  Liver Function Tests: Recent Labs  Lab 11/12/20 1502 11/14/20 0520 11/15/20 0533  AST 16 33 33  ALT 12 25 25   ALKPHOS 36* 31* 35*  BILITOT 1.2 0.3 0.7  PROT 7.6 6.5 6.0*  ALBUMIN 3.9 2.9* 2.7*    CBG: Recent Labs  Lab 11/14/20 1207 11/14/20 1753 11/14/20 2146 11/15/20 0731 11/15/20 1141  GLUCAP 153* 136* 234* 124* 201*     Recent Results (from the past 240 hour(s))  Resp Panel by RT-PCR (Flu Amor Packard&B, Covid) Nasopharyngeal Swab     Status: None   Collection Time: 11/12/20  4:12 PM   Specimen: Nasopharyngeal Swab; Nasopharyngeal(NP) swabs in vial transport medium  Result Value Ref Range Status   SARS Coronavirus 2 by RT PCR NEGATIVE NEGATIVE Final    Comment: (NOTE) SARS-CoV-2 target nucleic acids are NOT DETECTED.  The SARS-CoV-2 RNA is generally detectable in upper respiratory specimens during the acute phase of infection. The lowest concentration of SARS-CoV-2 viral copies this assay can detect is 138 copies/mL. Velicia Dejager negative result does not preclude SARS-Cov-2 infection and should not be used as the sole basis for treatment or other patient management decisions. Annet Manukyan negative result may occur with  improper specimen collection/handling, submission of specimen other than nasopharyngeal swab, presence of viral mutation(s) within the areas targeted by this assay, and inadequate number of viral copies(<138 copies/mL). Francesa Eugenio negative result must be combined with clinical observations, patient history, and epidemiological information. The expected result is  Negative.  Fact Sheet for Patients:  11/14/20  Fact Sheet for Healthcare Providers:  BloggerCourse.com  This test is no t yet approved or cleared by the [employer] FDA and  has been authorized for detection and/or diagnosis of SARS-CoV-2 by FDA under an Emergency Use Authorization (EUA). This EUA will remain  in effect (meaning this test can be used) for the duration of the COVID-19 declaration under Section 564(b)(1) of the Act, 21 U.S.C.section 360bbb-3(b)(1), unless the authorization is terminated  or revoked sooner.       Influenza [place] by PCR NEGATIVE NEGATIVE Final   Influenza B by PCR NEGATIVE NEGATIVE Final    Comment: (NOTE) The Xpert Xpress SARS-CoV-2/FLU/RSV plus assay is intended as an aid in the diagnosis of influenza from Nasopharyngeal swab specimens and should not be used as Din Bookwalter sole basis for treatment. Nasal washings and aspirates are unacceptable for Xpert Xpress SARS-CoV-2/FLU/RSV testing.  Fact Sheet for Patients: [place]  Fact Sheet for Healthcare Providers: BloggerCourse.com  This test is not yet approved or cleared by the [employer] FDA and has been authorized for detection and/or diagnosis of SARS-CoV-2 by FDA under an Emergency Use Authorization (EUA). This EUA will remain in effect (meaning this test can be used) for the duration of the COVID-19 declaration under Section 564(b)(1) of the Act, 21 U.S.C. section 360bbb-3(b)(1), unless the authorization is terminated or revoked.  Performed at [facility], [address], [place], [place]   Blood culture (routine x 2)     Status: None (Preliminary result)   Collection Time: 11/12/20  5:30 PM   Specimen: BLOOD  Result Value Ref Range Status   Specimen Description   Final    BLOOD RIGHT ANTECUBITAL Performed at [facility], [address] [place], [place]  Special Requests   Final    BOTTLES DRAWN AEROBIC AND ANAEROBIC Blood Culture adequate volume Performed at [facility], [address], [place], [place] [address]    Culture   Final    NO GROWTH 3 DAYS Performed at [facility], [address], [place], [place] [address]    Report Status PENDING  Incomplete  Blood culture (routine x 2)     Status: None (Preliminary result)   Collection Time: 11/12/20  5:44 PM   Specimen: BLOOD LEFT ARM  Result Value Ref Range Status   Specimen Description   Final    BLOOD LEFT ARM Performed at [facility] [address] [place], [place] [address]    Special Requests   Final    BOTTLES DRAWN AEROBIC AND ANAEROBIC Blood Culture adequate volume Performed at [facility], [address] [place], [place] [address]    Culture   Final    NO GROWTH 3 DAYS Performed at [facility], [address], [place], [place] [address]    Report Status PENDING  Incomplete         Radiology Studies: CT CHEST WO CONTRAST  Result Date: 11/13/2020 CLINICAL DATA:  Respiratory failure. EXAM: CT CHEST WITHOUT CONTRAST TECHNIQUE: Multidetector CT imaging of the chest was performed following the standard protocol without IV contrast. COMPARISON:  Chest radiograph November 12, 2020 FINDINGS: Cardiovascular: Normal heart size. Calcific atherosclerotic disease of the coronary arteries and aorta. Mild ectasia of the ascending aorta measuring 3.8 cm. Tortuosity of the aorta. Mediastinum/Nodes: No enlarged mediastinal or axillary lymph nodes. Thyroid gland, trachea, and esophagus demonstrate no significant findings. Lungs/Pleura: Dense airspace consolidation in the right lower lobe. No evidence of pleural effusion or pneumothorax. Upper Abdomen: No acute abnormality. Musculoskeletal: No chest wall mass or suspicious bone lesions identified. Spondylosis of the spine.  IMPRESSION: 1. Right lower lobe pneumonia. 2. Calcific atherosclerotic disease of the coronary arteries and aorta. 3. Mild ectasia of the ascending aorta measuring 3.8 cm. Aortic Atherosclerosis (ICD10-I70.0). Electronically Signed   By: Ted Mcalpine M.D.   On: 11/13/2020 16:51        Scheduled Meds:  acetaminophen  1,000 mg Oral Q8H   amLODipine  5 mg Oral Daily   azithromycin  500 mg Oral q1800   enoxaparin (LOVENOX) injection  40 mg Subcutaneous Q24H   insulin aspart  0-9 Units Subcutaneous TID AC & HS   pravastatin  10 mg Oral Daily   Continuous Infusions:  sodium chloride 100 mL/hr at 11/14/20 1035   cefTRIAXone (ROCEPHIN)  IV 2 g (11/14/20 1719)     LOS: 3 days    Time spent: over 30 min    Lacretia Nicks, MD [facility]   To contact the attending provider between 7A-7P or the covering provider during after hours 7P-7A, please log into the web site [email or web address] and access using universal [place] password for that web site. If you do not have the password, please call the hospital operator.  11/15/2020, 3:13 PM

## 2020-11-15 NOTE — Care Management Important Message (Signed)
Important Message  Patient Details IM Letter placed in Patient's room Name: Timothy Reilly MRN: [id number] Date of Birth: 10-31-1932   Medicare Important Message Given:  Yes     [name], [name] 11/15/2020, 9:00 AM

## 2020-11-16 ENCOUNTER — Other Ambulatory Visit: Payer: Self-pay | Admitting: Infectious Diseases

## 2020-11-16 ENCOUNTER — Telehealth: Payer: Self-pay | Admitting: Internal Medicine

## 2020-11-16 ENCOUNTER — Inpatient Hospital Stay (HOSPITAL_COMMUNITY): Payer: PPO

## 2020-11-16 LAB — COMPREHENSIVE METABOLIC PANEL
ALT: 42 U/L (ref 0–44)
AST: 57 U/L — ABNORMAL HIGH (ref 15–41)
Albumin: 3.1 g/dL — ABNORMAL LOW (ref 3.5–5.0)
Alkaline Phosphatase: 40 U/L (ref 38–126)
Anion gap: 8 (ref 5–15)
BUN: 10 mg/dL (ref 8–23)
CO2: 26 mmol/L (ref 22–32)
Calcium: 8.5 mg/dL — ABNORMAL LOW (ref 8.9–10.3)
Chloride: 103 mmol/L (ref 98–111)
Creatinine, Ser: 0.8 mg/dL (ref 0.61–1.24)
GFR, Estimated: >60 mL/min (ref >60)
Glucose, Bld: 197 mg/dL — ABNORMAL HIGH (ref 70–99)
Potassium: 3.5 mmol/L (ref 3.5–5.1)
Sodium: 137 mmol/L (ref 135–145)
Total Bilirubin: 0.4 mg/dL (ref 0.3–1.2)
Total Protein: 7 g/dL (ref 6.5–8.1)

## 2020-11-16 LAB — CBC WITH DIFFERENTIAL/PLATELET
Abs Immature Granulocytes: 0.03 10*3/uL (ref 0.00–0.07)
Basophils Absolute: 0 10*3/uL (ref 0.0–0.1)
Basophils Relative: 0 %
Eosinophils Absolute: 0.2 10*3/uL (ref 0.0–0.5)
Eosinophils Relative: 3 %
HCT: 39.4 % (ref 39.0–52.0)
Hemoglobin: 13.4 g/dL (ref 13.0–17.0)
Immature Granulocytes: 0 %
Lymphocytes Relative: 12 %
Lymphs Abs: 0.9 10*3/uL (ref 0.7–4.0)
MCH: 31.3 pg (ref 26.0–34.0)
MCHC: 34 g/dL (ref 30.0–36.0)
MCV: 92.1 fL (ref 80.0–100.0)
Monocytes Absolute: 0.5 10*3/uL (ref 0.1–1.0)
Monocytes Relative: 7 %
Neutro Abs: 5.6 10*3/uL (ref 1.7–7.7)
Neutrophils Relative %: 78 %
Platelets: 280 10*3/uL (ref 150–400)
RBC: 4.28 MIL/uL (ref 4.22–5.81)
RDW: 13.5 % (ref 11.5–15.5)
WBC: 7.2 10*3/uL (ref 4.0–10.5)
nRBC: 0 % (ref 0.0–0.2)

## 2020-11-16 LAB — GLUCOSE, CAPILLARY
Glucose-Capillary: 138 mg/dL — ABNORMAL HIGH (ref 70–99)
Glucose-Capillary: 182 mg/dL — ABNORMAL HIGH (ref 70–99)

## 2020-11-16 LAB — PROCALCITONIN: Procalcitonin: <0.1 ng/mL

## 2020-11-16 MED ORDER — ACETAMINOPHEN 325 MG PO TABS: 650.0000 mg | ORAL_TABLET | Freq: Four times a day (QID) | ORAL | Status: DC | PRN

## 2020-11-16 MED ORDER — AMOXICILLIN 500 MG PO CAPS: 1000.0000 mg | ORAL_CAPSULE | Freq: Three times a day (TID) | ORAL | 0 refills | Status: AC

## 2020-11-16 NOTE — Progress Notes (Signed)
SATURATION QUALIFICATIONS: (This note is used to comply with regulatory documentation for home oxygen)  Patient Saturations on Room Air at Rest = 97%  Patient Saturations on Room Air while Ambulating = 96%  Patient Saturations on 0 Liters of oxygen while Ambulating = 96%  Please briefly explain why patient needs home oxygen: Pt does not qualify for home oxygen.

## 2020-11-16 NOTE — Progress Notes (Signed)
PROGRESS NOTE    Timothy Reilly  NAT:[id number] DOB: [birth year] DOA: 11/12/2020 PCP: Timothy Laughter, MD   Chief Complaint  Patient presents with   Fever   Fatigue    Brief Narrative:  Timothy Reilly is Timothy Reilly 87 y.o. male with medical history significant for type 2 diabetes mellitus, hypertension and dyslipidemia, who presented to the emergency room with acute onset of fever and generalized fatigue and tiredness today.  The patient is usually fairly active and is often in his garden and exercises regularly.  His T-max was up to 102.  He took Tylenol and cold compresses were applied to his forehead.  He states she came down below 101 and then rebounded back to 102.  His blood glucose level has been elevated over the last 24 hours.  He is fully vaccinated for COVID-19.  No recent sick exposure.  He denies any chest pain or dyspnea or cough or wheezing.  No neck pain or stiffness.  No headache or dizziness or blurred vision.  Denies any nausea or vomiting or abdominal pain.  No dysuria urinary frequency urgency or oliguria or flank pain.   ED Course: Temperature was 101.1 and later 104.3 and heart rate 95 with otherwise normal vital signs.Labs revealed hyponatremia hypochloremia and hyperglycemia.  Lactic acid was 1.2 and CBC showed WBC of 11.   Imaging: Two-view chest x-ray showed small focus right basal airspace opacity concerning for pneumonia.  The patient was given 650 mg p.o. Tylenol, IV Rocephin and Zithromax and 1 L bolus of IV normal saline.  He will be [facility] monitored [facility] for further evaluation and management.  Assessment & Plan:   Active Problems:   CAP (community acquired pneumonia)   1.  Sepsis 2/2 Community Acquired Pneumonia .  This is manifested by high fever and mild tachycardia. - ceftriaxone, azithromycin (6/14-present) -- fever curve improving, last true fever 6/14.  Continues to have elevated temperatures (last to 100 F last night) -- sputum cx (pending  collection), urine strep (negative), urine legionella (pending) -- SLP eval - no limitations -- blood cx NG -- CT chest with right lower lobe pneumonia - continue APAP prn, ibuprofen prn.  Fever curve overall improved.  He continues to have fluctuations in his temperatures on scheduled tylenol with temperatures as high as 100 degrees F last night.  Not Adilyn Humes true fever, but he's on scheduled tylenol and has had 4 days of appropriate abx therapy.  Will request eval by pulm/ID to see if any additional recommendations.  2.  Hyponatremia and hypochloremia likely hypovolemic. - improved  3.  Type 2 diabetes mellitus. - The patient will be placed on supplement coverage with NovoLog. - We will hold off metformin.  4.  Dyslipidemia. - We will continue statin therapy.  5.  Essential hypertension. - We will continue Norvasc and hold off HCTZ..  Mild ectasia of ascending aorta Follow outpatient  DVT prophylaxis: lovenox Code Status: DNR Family Communication: wife at bedside Disposition:   Status is: Inpatient  Remains inpatient appropriate because:Inpatient level of care appropriate due to severity of illness  Dispo: The patient is from: Home              Anticipated d/c is to: Home              Patient currently is not medically stable to d/c.   Difficult to place patient No       Consultants:  none  Procedures:  none  Antimicrobials:  Anti-infectives (From admission, onward)    Start     Dose/Rate Route Frequency Ordered Stop   11/14/20 1800  azithromycin (ZITHROMAX) tablet 500 mg        500 mg Oral Daily-1800 11/14/20 1024     11/13/20 1800  cefTRIAXone (ROCEPHIN) 2 g in sodium chloride 0.9 % 100 mL IVPB        2 g 200 mL/hr over 30 Minutes Intravenous Every 24 hours 11/12/20 2010     11/13/20 1800  azithromycin (ZITHROMAX) 500 mg in sodium chloride 0.9 % 250 mL IVPB  Status:  Discontinued        500 mg 250 mL/hr over 60 Minutes Intravenous Every 24 hours 11/12/20 2014  11/14/20 1024   11/12/20 1845  cefTRIAXone (ROCEPHIN) 1 g in sodium chloride 0.9 % 100 mL IVPB        1 g 200 mL/hr over 30 Minutes Intravenous  Once 11/12/20 1840 11/12/20 1926   11/12/20 1845  azithromycin (ZITHROMAX) 500 mg in sodium chloride 0.9 % 250 mL IVPB        500 mg 250 mL/hr over 60 Minutes Intravenous  Once 11/12/20 1840 11/12/20 2000   11/12/20 1800  azithromycin (ZITHROMAX) 500 mg in sodium chloride 0.9 % 250 mL IVPB  Status:  Discontinued        500 mg 250 mL/hr over 60 Minutes Intravenous Every 24 hours 11/12/20 2010 11/12/20 2014          Subjective: No new complaints Wife concerned they may go home and he get worse  Objective: Vitals:   11/15/20 2212 11/16/20 0633 11/16/20 0715 11/16/20 1222  BP: 131/65 (!) 116/54  129/77  Pulse: 79 69  65  Resp: 17 17  16   Temp: 100 F (37.8 C) 99 F (37.2 C)  97.6 F (36.4 C)  TempSrc: Oral Oral  Oral  SpO2: 94% 93% 100% 100%  Weight:      Height:        Intake/Output Summary (Last 24 hours) at 11/16/2020 1444 Last data filed at 11/16/2020 0600 Gross per 24 hour  Intake 240 ml  Output --  Net 240 ml    Filed Weights   11/12/20 1449 11/13/20 0615  Weight: 57.2 kg 55.4 kg    Examination:  General: No acute distress. Cardiovascular: Heart sounds show Timothy Reilly regular rate, and rhythm.  Lungs: R basilar crackles Abdomen: Soft, nontender, nondistended  Neurological: Alert and oriented 3. Moves all extremities 4 . Cranial nerves II through XII grossly intact. Skin: Warm and dry. No rashes or lesions. Extremities: No clubbing or cyanosis. No edema.   Data Reviewed: I have personally reviewed following labs and imaging studies  CBC: Recent Labs  Lab 11/12/20 1502 11/12/20 2030 11/13/20 0534 11/14/20 0520 11/15/20 0533 11/16/20 1014  WBC 11.3* 10.1 9.0 8.1 8.2 7.2  NEUTROABS 9.9* 8.8*  --  6.9 6.5 5.6  HGB 13.3 12.8* 12.8* 12.6* 12.4* 13.4  HCT 39.1 37.4* 39.4 37.3* 36.2* 39.4  MCV 92.9 93.0 94.0 91.9  91.6 92.1  PLT 229 218 193 204 214 280    Basic Metabolic Panel: Recent Labs  Lab 11/12/20 1502 11/13/20 0534 11/13/20 2210 11/14/20 0520 11/15/20 0533 11/16/20 1014  NA 130* 137  --  136 138 137  K 3.7 3.6  --  3.0* 3.2* 3.5  CL 94* 101  --  104 106 103  CO2 28 28  --  25 22 26   GLUCOSE 197* 138*  --  147* 128*  197*  BUN 15 12  --  12 8 10   CREATININE 0.91 0.88  --  0.68 0.92 0.80  CALCIUM 8.9 8.7*  --  8.1* 7.9* 8.5*  MG  --   --   --  2.0 2.1  --   PHOS  --   --  2.7 1.8*  --   --     GFR: Estimated Creatinine Clearance: 51.9 mL/min (by C-G formula based on SCr of 0.8 mg/dL).  Liver Function Tests: Recent Labs  Lab 11/12/20 1502 11/14/20 0520 11/15/20 0533 11/16/20 1014  AST 16 33 33 57*  ALT 12 25 25  42  ALKPHOS 36* 31* 35* 40  BILITOT 1.2 0.3 0.7 0.4  PROT 7.6 6.5 6.0* 7.0  ALBUMIN 3.9 2.9* 2.7* 3.1*    CBG: Recent Labs  Lab 11/15/20 1141 11/15/20 1834 11/15/20 2214 11/16/20 0742 11/16/20 1219  GLUCAP 201* 242* 187* 138* 182*     Recent Results (from the past 240 hour(s))  Resp Panel by RT-PCR (Flu Kordae Buonocore&B, Covid) Nasopharyngeal Swab     Status: None   Collection Time: 11/12/20  4:12 PM   Specimen: Nasopharyngeal Swab; Nasopharyngeal(NP) swabs in vial transport medium  Result Value Ref Range Status   SARS Coronavirus 2 by RT PCR NEGATIVE NEGATIVE Final    Comment: (NOTE) SARS-CoV-2 target nucleic acids are NOT DETECTED.  The SARS-CoV-2 RNA is generally detectable in upper respiratory specimens during the acute phase of infection. The lowest concentration of SARS-CoV-2 viral copies this assay can detect is 138 copies/mL. Jakari Jacot negative result does not preclude SARS-Cov-2 infection and should not be used as the sole basis for treatment or other patient management decisions. Jensen Kilburg negative result may occur with  improper specimen collection/handling, submission of specimen other than nasopharyngeal swab, presence of viral mutation(s) within the areas  targeted by this assay, and inadequate number of viral copies(<138 copies/mL). Ayelen Sciortino negative result must be combined with clinical observations, patient history, and epidemiological information. The expected result is Negative.  Fact Sheet for Patients:  BloggerCourse.comhttps://www.fda.gov/media/152166/download  Fact Sheet for Healthcare Providers:  SeriousBroker.ithttps://www.fda.gov/media/152162/download  This test is no t yet approved or cleared by the [place] FDA and  has been authorized for detection and/or diagnosis of SARS-CoV-2 by FDA under an Emergency Use Authorization (EUA). This EUA will remain  in effect (meaning this test can be used) for the duration of the COVID-19 declaration under Section 564(b)(1) of the Act, 21 U.S.C.section 360bbb-3(b)(1), unless the authorization is terminated  or revoked sooner.       Influenza Avery Klingbeil by PCR NEGATIVE NEGATIVE Final   Influenza B by PCR NEGATIVE NEGATIVE Final    Comment: (NOTE) The Xpert Xpress SARS-CoV-2/FLU/RSV plus assay is intended as an aid in the diagnosis of influenza from Nasopharyngeal swab specimens and should not be used as Nayomi Tabron sole basis for treatment. Nasal washings and aspirates are unacceptable for Xpert Xpress SARS-CoV-2/FLU/RSV testing.  Fact Sheet for Patients: BloggerCourse.comhttps://www.fda.gov/media/152166/download  Fact Sheet for Healthcare Providers: SeriousBroker.ithttps://www.fda.gov/media/152162/download  This test is not yet approved or cleared by the [place] FDA and has been authorized for detection and/or diagnosis of SARS-CoV-2 by FDA under an Emergency Use Authorization (EUA). This EUA will remain in effect (meaning this test can be used) for the duration of the COVID-19 declaration under Section 564(b)(1) of the Act, 21 U.S.C. section 360bbb-3(b)(1), unless the authorization is terminated or revoked.  Performed at [facility], [address] [place], [place] [id number]   Blood culture (routine x  2)     Status: None  (Preliminary result)   Collection Time: 11/12/20  5:30 PM   Specimen: BLOOD  Result Value Ref Range Status   Specimen Description   Final    BLOOD RIGHT ANTECUBITAL Performed at [facility], [address] [place], [place] [address]    Special Requests   Final    BOTTLES DRAWN AEROBIC AND ANAEROBIC Blood Culture adequate volume Performed at [facility], [address], [place], [place] [address]    Culture   Final    NO GROWTH 3 DAYS Performed at [facility], [address], [place], [place] [address]    Report Status PENDING  Incomplete  Blood culture (routine x 2)     Status: None (Preliminary result)   Collection Time: 11/12/20  5:44 PM   Specimen: BLOOD LEFT ARM  Result Value Ref Range Status   Specimen Description   Final    BLOOD LEFT ARM Performed at [facility], [address] [place], [place] [address]    Special Requests   Final    BOTTLES DRAWN AEROBIC AND ANAEROBIC Blood Culture adequate volume Performed at [facility], [address] [place], [place] [address]    Culture   Final    NO GROWTH 3 DAYS Performed at [facility], [address], [place], [place] [address]    Report Status PENDING  Incomplete         Radiology Studies: DG CHEST PORT 1 VIEW  Result Date: 11/16/2020 CLINICAL DATA:  Fever, pneumonia EXAM: PORTABLE CHEST 1 VIEW COMPARISON:  11/12/2020 FINDINGS: Progressive infiltrate in the right lower lobe compatible with pneumonia. Progressive small right effusion Left lung remains clear.  Negative for heart failure. IMPRESSION: Progressive right lower lobe pneumonia. Progression of small right pleural effusion. Electronically Signed   By: Marlan Palau M.D.   On: 11/16/2020 14:27        Scheduled Meds:  amLODipine  5 mg Oral Daily   azithromycin  500 mg Oral q1800   enoxaparin (LOVENOX) injection  40 mg Subcutaneous Q24H   insulin aspart  0-9  Units Subcutaneous TID AC & HS   pravastatin  10 mg Oral Daily   Continuous Infusions:  sodium chloride 100 mL/hr at 11/14/20 1035   cefTRIAXone (ROCEPHIN)  IV 2 g (11/15/20 1746)     LOS: 4 days    Time spent: over 30 min    Lacretia Nicks, MD [facility]   To contact the attending provider between 7A-7P or the covering provider during after hours 7P-7A, please log into the web site [email or web address] and access using universal [place] password for that web site. If you do not have the password, please call the hospital operator.  11/16/2020, 2:44 PM

## 2020-11-16 NOTE — Telephone Encounter (Signed)
Pulmonary triage  This is a patient at [facility] was getting discharged today for pneumonia right lower lobe.  The hospitalist Dr. Lowell Guitar is discharging the patient today.  Pulmonary has not seen this patient.  However he feels patient will benefit from a pulmonary follow-up  Plan - Please give patient an appointment in the next few days as new consult

## 2020-11-16 NOTE — Discharge Summary (Signed)
Physician Discharge Summary  AXAVIER PRESSLEY KDX:[id number] DOB: Feb 04, 1933 DOA: 11/12/2020  PCP: Merlene Laughter, MD  Admit date: 11/12/2020 Discharge date: 11/16/2020  Time spent: 40 minutes  Recommendations for Outpatient Follow-up:  Follow outpatient CBC/CMP Follow fever curve outpatient, ensure continuing to improve Follow with pulmonology outpatient  Follow with Dr. Pete Glatter outpatient  Follow mild ectasia of ascending aorta  outpatient with PCP Holding HCTZ at discharge, follow with Dr. Pete Glatter  Discharge Diagnoses:  Active Problems:   CAP (community acquired pneumonia)   Discharge Condition: stable  Diet recommendation: heart healthy, diabetic  Filed Weights   11/12/20 1449 11/13/20 0615  Weight: 57.2 kg 55.4 kg    History of present illness:  Timothy Reilly is Timothy Reilly 87 y.o. male with medical history significant for type 2 diabetes mellitus, hypertension and dyslipidemia, who presented to the emergency room with acute onset of fever and generalized fatigue and tiredness today.  The patient is usually fairly active and is often in his garden and exercises regularly.  His T-max was up to 102.  He took Tylenol and cold compresses were applied to his forehead.  He states she came down below 101 and then rebounded back to 102.  His blood glucose level has been elevated over the last 24 hours.  He is fully vaccinated for COVID-19.  No recent sick exposure.  He denies any chest pain or dyspnea or cough or wheezing.  No neck pain or stiffness.  No headache or dizziness or blurred vision.  Denies any nausea or vomiting or abdominal pain.  No dysuria urinary frequency urgency or oliguria or flank pain.   ED Course: Temperature was 101.1 and later 104.3 and heart rate 95 with otherwise normal vital signs.Labs revealed hyponatremia hypochloremia and hyperglycemia.  Lactic acid was 1.2 and CBC showed WBC of 11.   Imaging: Two-view chest x-ray showed small focus right basal airspace opacity  concerning for pneumonia.  The patient was given 650 mg p.o. Tylenol, IV Rocephin and Zithromax and 1 L bolus of IV normal saline.  He will be [facility] for further evaluation and management.  He's been admitted for community acquired pneumonia.  He's gradually improved with antibiotics.  His fever curve has fluctuated, but is overall improving.  Still with some fluctuations on the day prior to discharge.  Recommended close outpatient follow up with PCP as well as pulm referral.    Hospital Course:  1.  Sepsis 2/2 Community Acquired Pneumonia .  This is manifested by high fever and mild tachycardia. - ceftriaxone, azithromycin (6/14-6/18).  Discharge on amoxicillin. -- fever curve improving, last true fever 6/14.  Continues to have elevated temperatures (last to 100 F last night), though no true recurrent fevers.  -- sputum cx (pending collection), urine strep (negative), urine legionella (negative) -- SLP eval - no limitations -- blood cx NG -- CT chest with right lower lobe pneumonia -- CXR today with progressive right lower lobe pneumonia, small R effusion - continue APAP prn, ibuprofen prn.  Fever curve overall improved.  He looks improved overall, but he continues to have fluctuations in his temperatures on scheduled tylenol with temperatures as high as 100 degrees F last night.  Not Jarrel Knoke true fever, but he's on scheduled tylenol and has had 4 days of appropriate abx therapy.  Had initially planned on pulm/ID evaluation in house, but pt/wife asking about outpatient follow up, which I think is reasonable given his clinical improvement - continuing to sat well  on RA.  Plan to follow outpatient with PCP as well as recommend outpatient pulm follow up.  Discharge with amoxicillin.    2.  Hyponatremia and hypochloremia likely hypovolemic. - improved  3.  Type 2 diabetes mellitus. - resume home meds  4.  Dyslipidemia. - We will continue statin therapy.  5.  Essential  hypertension. - We will continue Norvasc and hold off HCTZ.   Mild ectasia of ascending aorta Follow outpatient  Procedures: none  Consultations: none  Discharge Exam: Vitals:   11/16/20 1222 11/16/20 1716  BP: 129/77 120/62  Pulse: 65 72  Resp: 16 17  Temp: 97.6 F (36.4 C) (!) 97.1 F (36.2 C)  SpO2: 100% 96%   Feeling ebtter overall Discussed d/c plan - discussed staying for observation and pulm/ID c/s vs following outpatient - decided on outpatient with close follow up - return precautions discussed  See progress note for exam earleir today Discharge Instructions   Discharge Instructions     Ambulatory referral to Pulmonology   Complete by: As directed    Reason for referral: Other Comment - pneumonia   Call MD for:  difficulty breathing, headache or visual disturbances   Complete by: As directed    Call MD for:  difficulty breathing, headache or visual disturbances   Complete by: As directed    Call MD for:  extreme fatigue   Complete by: As directed    Call MD for:  extreme fatigue   Complete by: As directed    Call MD for:  hives   Complete by: As directed    Call MD for:  hives   Complete by: As directed    Call MD for:  persistant dizziness or light-headedness   Complete by: As directed    Call MD for:  persistant dizziness or light-headedness   Complete by: As directed    Call MD for:  persistant nausea and vomiting   Complete by: As directed    Call MD for:  persistant nausea and vomiting   Complete by: As directed    Call MD for:  redness, tenderness, or signs of infection (pain, swelling, redness, odor or green/yellow discharge around incision site)   Complete by: As directed    Call MD for:  redness, tenderness, or signs of infection (pain, swelling, redness, odor or green/yellow discharge around incision site)   Complete by: As directed    Call MD for:  severe uncontrolled pain   Complete by: As directed    Call MD for:  severe uncontrolled  pain   Complete by: As directed    Call MD for:  temperature >100.4   Complete by: As directed    Call MD for:  temperature >100.4   Complete by: As directed    Diet - low sodium heart healthy   Complete by: As directed    Diet - low sodium heart healthy   Complete by: As directed    Discharge instructions   Complete by: As directed    You were seen for pneumonia.   This has gradually improved with antibiotics.  Your fevers are better, but your temperature still fluctuates.  Please continue Kalee Mcclenathan course of antibiotics and follow outpatient with Dr. Pete Glatter and pulmonology.  If you have worsening symptoms, return to the hospital for reevaluation.  We'll send you home with amoxicillin for pneumonia.   We stopped your HCTZ here.  Please follow up with your PCP to determine if it should be resumed.  Return for new, recurrent, or worsening symptoms.    Please ask your PCP to request records from this hospitalization so they know what was done and what the next steps will be.   Increase activity slowly   Complete by: As directed    Increase activity slowly   Complete by: As directed       Allergies as of 11/16/2020   No Active Allergies      Medication List     STOP taking these medications    hydrochlorothiazide 25 MG tablet Commonly known as: HYDRODIURIL       TAKE these medications    acetaminophen 500 MG tablet Commonly known as: TYLENOL Take 500 mg by mouth every 8 (eight) hours as needed for fever.   amLODipine 5 MG tablet Commonly known as: NORVASC Take 5 mg by mouth daily.   amoxicillin 500 MG capsule Commonly known as: AMOXIL Take 2 capsules (1,000 mg total) by mouth 3 (three) times daily for 5 days. Start taking on: November 17, 2020   CoQ10 100 MG Caps Take 1 capsule by mouth 3 (three) times Shanele Nissan week.   glucosamine-chondroitin 500-400 MG tablet Take 1 tablet by mouth daily.   metFORMIN 500 MG 24 hr tablet Commonly known as: GLUCOPHAGE-XR Take 1,000 mg  by mouth in the morning and at bedtime.   MULTIVITAMIN ADULTS PO Take 1 tablet by mouth 3 (three) times Jahan Friedlander week.   pravastatin 20 MG tablet Commonly known as: PRAVACHOL Take 20 mg by mouth daily.   UNABLE TO FIND Pt takes Glucose Reduce; 1 tablet every day       No Active Allergies    The results of significant diagnostics from this hospitalization (including imaging, microbiology, ancillary and laboratory) are listed below for reference.    Significant Diagnostic Studies: DG Chest 2 View  Result Date: 11/12/2020 CLINICAL DATA:  Fever for the past 2 days. EXAM: CHEST - 2 VIEW COMPARISON:  None. FINDINGS: There is Digman Hackmann small focus of airspace opacity in the right lung base worrisome for pneumonia. The left lung is clear. No pneumothorax or pleural effusion. Heart size is normal. Aortic atherosclerosis noted. IMPRESSION: Small focus right basilar airspace opacity worrisome for pneumonia. Aortic Atherosclerosis (ICD10-I70.0). Electronically Signed   By: Drusilla Kanner M.D.   On: 11/12/2020 15:37   CT CHEST WO CONTRAST  Result Date: 11/13/2020 CLINICAL DATA:  Respiratory failure. EXAM: CT CHEST WITHOUT CONTRAST TECHNIQUE: Multidetector CT imaging of the chest was performed following the standard protocol without IV contrast. COMPARISON:  Chest radiograph November 12, 2020 FINDINGS: Cardiovascular: Normal heart size. Calcific atherosclerotic disease of the coronary arteries and aorta. Mild ectasia of the ascending aorta measuring 3.8 cm. Tortuosity of the aorta. Mediastinum/Nodes: No enlarged mediastinal or axillary lymph nodes. Thyroid gland, trachea, and esophagus demonstrate no significant findings. Lungs/Pleura: Dense airspace consolidation in the right lower lobe. No evidence of pleural effusion or pneumothorax. Upper Abdomen: No acute abnormality. Musculoskeletal: No chest wall mass or suspicious bone lesions identified. Spondylosis of the spine. IMPRESSION: 1. Right lower lobe pneumonia. 2.  Calcific atherosclerotic disease of the coronary arteries and aorta. 3. Mild ectasia of the ascending aorta measuring 3.8 cm. Aortic Atherosclerosis (ICD10-I70.0). Electronically Signed   By: Ted Mcalpine M.D.   On: 11/13/2020 16:51   DG CHEST PORT 1 VIEW  Result Date: 11/16/2020 CLINICAL DATA:  Fever, pneumonia EXAM: PORTABLE CHEST 1 VIEW COMPARISON:  11/12/2020 FINDINGS: Progressive infiltrate in the right lower lobe compatible with pneumonia. Progressive small right  effusion Left lung remains clear.  Negative for heart failure. IMPRESSION: Progressive right lower lobe pneumonia. Progression of small right pleural effusion. Electronically Signed   By: Marlan Palau M.D.   On: 11/16/2020 14:27    Microbiology: Recent Results (from the past 240 hour(s))  Resp Panel by RT-PCR (Flu Kilie Rund&B, Covid) Nasopharyngeal Swab     Status: None   Collection Time: 11/12/20  4:12 PM   Specimen: Nasopharyngeal Swab; Nasopharyngeal(NP) swabs in vial transport medium  Result Value Ref Range Status   SARS Coronavirus 2 by RT PCR NEGATIVE NEGATIVE Final    Comment: (NOTE) SARS-CoV-2 target nucleic acids are NOT DETECTED.  The SARS-CoV-2 RNA is generally detectable in upper respiratory specimens during the acute phase of infection. The lowest concentration of SARS-CoV-2 viral copies this assay can detect is 138 copies/mL. Ames Hoban negative result does not preclude SARS-Cov-2 infection and should not be used as the sole basis for treatment or other patient management decisions. Teva Bronkema negative result may occur with  improper specimen collection/handling, submission of specimen other than nasopharyngeal swab, presence of viral mutation(s) within the areas targeted by this assay, and inadequate number of viral copies(<138 copies/mL). Milyn Stapleton negative result must be combined with clinical observations, patient history, and epidemiological information. The expected result is Negative.  Fact Sheet for Patients:   BloggerCourse.com  Fact Sheet for Healthcare Providers:  SeriousBroker.it  This test is no t yet approved or cleared by the [place] FDA and  has been authorized for detection and/or diagnosis of SARS-CoV-2 by FDA under an Emergency Use Authorization (EUA). This EUA will remain  in effect (meaning this test can be used) for the duration of the COVID-19 declaration under Section 564(b)(1) of the Act, 21 U.S.C.section 360bbb-3(b)(1), unless the authorization is terminated  or revoked sooner.       Influenza Aundrey Elahi by PCR NEGATIVE NEGATIVE Final   Influenza B by PCR NEGATIVE NEGATIVE Final    Comment: (NOTE) The Xpert Xpress SARS-CoV-2/FLU/RSV plus assay is intended as an aid in the diagnosis of influenza from Nasopharyngeal swab specimens and should not be used as Minna Dumire sole basis for treatment. Nasal washings and aspirates are unacceptable for Xpert Xpress SARS-CoV-2/FLU/RSV testing.  Fact Sheet for Patients: BloggerCourse.com  Fact Sheet for Healthcare Providers: SeriousBroker.it  This test is not yet approved or cleared by the [place] FDA and has been authorized for detection and/or diagnosis of SARS-CoV-2 by FDA under an Emergency Use Authorization (EUA). This EUA will remain in effect (meaning this test can be used) for the duration of the COVID-19 declaration under Section 564(b)(1) of the Act, 21 U.S.C. section 360bbb-3(b)(1), unless the authorization is terminated or revoked.  Performed at [facility], [address] [place], [place] [address]   Blood culture (routine x 2)     Status: None (Preliminary result)   Collection Time: 11/12/20  5:30 PM   Specimen: BLOOD  Result Value Ref Range Status   Specimen Description   Final    BLOOD RIGHT ANTECUBITAL Performed at [facility], [address], [place], [place] [address]     Special Requests   Final    BOTTLES DRAWN AEROBIC AND ANAEROBIC Blood Culture adequate volume Performed at [facility], [address] [place], [place] [address]    Culture   Final    NO GROWTH 4 DAYS Performed at [facility], [address], [place], [place] [address]    Report Status PENDING  Incomplete  Blood culture (routine x 2)     Status: None (Preliminary result)   Collection Time: 11/12/20  5:44 PM   Specimen: BLOOD LEFT ARM  Result Value Ref Range Status   Specimen Description   Final    BLOOD LEFT ARM Performed at [facility], [address] [place], [place] [address]    Special Requests   Final    BOTTLES DRAWN AEROBIC AND ANAEROBIC Blood Culture adequate volume Performed at [facility], [address] [place], [place] [address]    Culture   Final    NO GROWTH 4 DAYS Performed at [facility], [address] [place], [place] [address]    Report Status PENDING  Incomplete     Labs: Basic Metabolic Panel: Recent Labs  Lab 11/12/20 1502 11/13/20 0534 11/13/20 2210 11/14/20 0520 11/15/20 0533 11/16/20 1014  NA 130* 137  --  136 138 137  K 3.7 3.6  --  3.0* 3.2* 3.5  CL 94* 101  --  104 106 103  CO2 28 28  --  25 22 26   GLUCOSE 197* 138*  --  147* 128* 197*  BUN 15 12  --  12 8 10   CREATININE 0.91 0.88  --  0.68 0.92 0.80  CALCIUM 8.9 8.7*  --  8.1* 7.9* 8.5*  MG  --   --   --  2.0 2.1  --   PHOS  --   --  2.7 1.8*  --   --    Liver Function Tests: Recent Labs  Lab 11/12/20 1502 11/14/20 0520 11/15/20 0533 11/16/20 1014  AST 16 33 33 57*  ALT 12 25 25  42  ALKPHOS 36* 31* 35* 40  BILITOT 1.2 0.3 0.7 0.4  PROT 7.6 6.5 6.0* 7.0  ALBUMIN 3.9 2.9* 2.7* 3.1*   No results for input(s): LIPASE, AMYLASE in the last 168 hours. No results for input(s): AMMONIA in the last 168 hours. CBC: Recent Labs  Lab 11/12/20 1502 11/12/20 2030 11/13/20 0534 11/14/20 0520 11/15/20 0533  11/16/20 1014  WBC 11.3* 10.1 9.0 8.1 8.2 7.2  NEUTROABS 9.9* 8.8*  --  6.9 6.5 5.6  HGB 13.3 12.8* 12.8* 12.6* 12.4* 13.4  HCT 39.1 37.4* 39.4 37.3* 36.2* 39.4  MCV 92.9 93.0 94.0 91.9 91.6 92.1  PLT 229 218 193 204 214 280   Cardiac Enzymes: No results for input(s): CKTOTAL, CKMB, CKMBINDEX, TROPONINI in the last 168 hours. BNP: BNP (last 3 results) No results for input(s): BNP in the last 8760 hours.  ProBNP (last 3 results) No results for input(s): PROBNP in the last 8760 hours.  CBG: Recent Labs  Lab 11/15/20 1141 11/15/20 1834 11/15/20 2214 11/16/20 0742 11/16/20 1219  GLUCAP 201* 242* 187* 138* 182*       Signed:  Lacretia Nicksaldwell Powell MD.  [facility] 11/16/2020, 7:37 PM

## 2020-11-17 LAB — CULTURE, BLOOD (ROUTINE X 2)
Culture: NO GROWTH
Culture: NO GROWTH
Special Requests: ADEQUATE
Special Requests: ADEQUATE

## 2020-11-18 NOTE — Telephone Encounter (Signed)
No openings for new consult with provider until 7/8 but can get pt in for a hospital follow up appt with [facility] 6/24.  Called and spoke with pt's wife Nicole Kindred letting her know the info stated by MR that we needed to get pt scheduled for an appt and have scheduled pt an appt with [facility] 6/24.   I did provide Nicole Kindred with our office address and phone number. Nothing further needed.

## 2020-11-20 DIAGNOSIS — J189 Pneumonia, unspecified organism: Secondary | ICD-10-CM | POA: Diagnosis not present

## 2020-11-20 DIAGNOSIS — Z7984 Long term (current) use of oral hypoglycemic drugs: Secondary | ICD-10-CM | POA: Diagnosis not present

## 2020-11-20 DIAGNOSIS — Z79899 Other long term (current) drug therapy: Secondary | ICD-10-CM | POA: Diagnosis not present

## 2020-11-20 DIAGNOSIS — I10 Essential (primary) hypertension: Secondary | ICD-10-CM | POA: Diagnosis not present

## 2020-11-20 DIAGNOSIS — E1169 Type 2 diabetes mellitus with other specified complication: Secondary | ICD-10-CM | POA: Diagnosis not present

## 2020-11-22 ENCOUNTER — Ambulatory Visit (INDEPENDENT_AMBULATORY_CARE_PROVIDER_SITE_OTHER): Payer: PPO

## 2020-11-22 ENCOUNTER — Ambulatory Visit: Payer: PPO | Admitting: Primary Care

## 2020-11-22 ENCOUNTER — Other Ambulatory Visit: Payer: Self-pay

## 2020-11-22 ENCOUNTER — Encounter: Payer: Self-pay | Admitting: Primary Care

## 2020-11-22 VITALS — BP 110/58 | HR 77 | Temp 97.8°F | Ht 66.0 in | Wt 123.8 lb

## 2020-11-22 DIAGNOSIS — J189 Pneumonia, unspecified organism: Secondary | ICD-10-CM

## 2020-11-22 DIAGNOSIS — J90 Pleural effusion, not elsewhere classified: Secondary | ICD-10-CM | POA: Diagnosis not present

## 2020-11-22 NOTE — Assessment & Plan Note (Addendum)
-   Hospitalized for RLL pneumonia between 6/14-6/18. Treated with IV ceftriaxone/azithromycin, discharged on oral Augmentin. Clinically he continues to do well, no respiratory complaints. He completed Augmentin course. He remains afebrile. Lungs clear on exam. He has some minor residual fatigue/weakness, appetite is coming back. He lost 5 lbs. CXR today showed near complete resolution right lower lobe pneumonia. Recommend he take in 1-2 boost/ensure shakes a day to help with weight. No further follow-up warranted unless he develops new or worsening respiratory symptoms

## 2020-11-22 NOTE — Progress Notes (Signed)
@Patient  ID: , male    DOB: [birth year], 87 y.o.   MRN: [id number]  Chief Complaint  Patient presents with   Follow-up    HOSPITAL F/U -     Referring provider: [id number], MD  HPI: 87 year old male, smoked.  Past medical history significant for type 2 diabetes, hypertension and dyslipidemia.  Patient was recently admitted from 6/14-6/18 for community-acquired pneumonia, he was not consulted inpatient by pulmonary team.  11/22/2020 Patient presents today for hospital follow-up for community-acquired pneumonia.  He initially presented with fatigue and fevers. Wife states that he swims everyday at [facility] 24 laps. He came come tired and had a temperature of 102. CT chest showed right lower lobe pneumonia.  Chest x-ray on 6/18 showed progression of right lower lobe pneumonia, small right effusion.  He was treated with IV ceftriaxone and azithromycin.  Initially planned on pulmonary/ID evaluation while inpatient but patient is wife was asking about outpatient follow-up.  Patient's clinical status improved.  Oxygen saturated well on room air. SLP eval with no limitations. He was discharged on amoxicillin. He is doing well today, no acute respiratory complaints. He has remained afebrile.  Blood culture were negative. He has some voice hoarseness. He still feels some weak. He began to get his appetite back 2 days ago. He has lost 5 lbs since having pneumonia. Denies cough, chest pain/tightness or shortness of breath.   No Known Allergies  Immunization History  Administered Date(s) Administered   Influenza Split 04/16/2011, 04/20/2012, 04/18/2013, 03/07/2014, 04/29/2016   Influenza, High Dose Seasonal PF 05/14/2015, 03/16/2017, 02/23/2018, 03/29/2019, 03/20/2020   PFIZER(Purple Top)SARS-COV-2 Vaccination 06/20/2019, 07/11/2019, 03/09/2020   Pneumococcal Conjugate-13 07/02/2014   Pneumococcal Polysaccharide-23 06/23/2000, 07/16/2016   Td 06/23/2006   Tdap 07/16/2016   Zoster, Live  09/23/2020    Past Medical History:  Diagnosis Date   Diabetes mellitus without complication (HCC)    Hyperlipidemia    Hypertension     Tobacco History: Social History   Tobacco Use  Smoking Status Never  Smokeless Tobacco Never   Counseling given: Not Answered   Outpatient Medications Prior to Visit  Medication Sig Dispense Refill   acetaminophen (TYLENOL) 500 MG tablet Take 500 mg by mouth every 8 (eight) hours as needed for fever.     amLODipine (NORVASC) 5 MG tablet Take 5 mg by mouth daily.     amoxicillin (AMOXIL) 500 MG capsule Take 2 capsules (1,000 mg total) by mouth 3 (three) times daily for 5 days. 30 capsule 0   Coenzyme Q10 (COQ10) 100 MG CAPS Take 1 capsule by mouth 3 (three) times a week.     glucosamine-chondroitin 500-400 MG tablet Take 1 tablet by mouth daily.     metFORMIN (GLUCOPHAGE-XR) 500 MG 24 hr tablet Take 1,000 mg by mouth in the morning and at bedtime.     Multiple Vitamins-Minerals (MULTIVITAMIN ADULTS PO) Take 1 tablet by mouth 3 (three) times a week.     pravastatin (PRAVACHOL) 20 MG tablet Take 20 mg by mouth daily.     UNABLE TO FIND Pt takes Glucose Reduce; 1 tablet every day     No facility-administered medications prior to visit.    Review of Systems  Review of Systems  Constitutional: Negative.   HENT: Negative.    Respiratory: Negative.      Physical Exam  BP (!) 110/58 (BP Location: Left Arm, Patient Position: Sitting, Cuff Size: Normal)   Pulse 77   Temp 97.8 F (36.6 C) (Oral)  Ht 5\' 6"  (1.676 m)   Wt 123 lb 12.8 oz (56.2 kg)   SpO2 99%   BMI 19.98 kg/m  Physical Exam Constitutional:      General: He is not in acute distress.    Appearance: Normal appearance. He is not ill-appearing.     Comments: Appears well, thin frame  Cardiovascular:     Rate and Rhythm: Normal rate and regular rhythm.  Pulmonary:     Effort: Pulmonary effort is normal.     Breath sounds: Normal breath sounds. No wheezing, rhonchi or rales.      Comments: CTA Neurological:     General: No focal deficit present.     Mental Status: He is alert and oriented to person, place, and time. Mental status is at baseline.  Psychiatric:        Mood and Affect: Mood normal.        Behavior: Behavior normal.        Thought Content: Thought content normal.        Judgment: Judgment normal.     Lab Results:  CBC    Component Value Date/Time   WBC 7.2 11/16/2020 1014   RBC 4.28 11/16/2020 1014   HGB 13.4 11/16/2020 1014   HCT 39.4 11/16/2020 1014   PLT 280 11/16/2020 1014   MCV 92.1 11/16/2020 1014   MCH 31.3 11/16/2020 1014   MCHC 34.0 11/16/2020 1014   RDW 13.5 11/16/2020 1014   LYMPHSABS 0.9 11/16/2020 1014   MONOABS 0.5 11/16/2020 1014   EOSABS 0.2 11/16/2020 1014   BASOSABS 0.0 11/16/2020 1014    BMET    Component Value Date/Time   NA 137 11/16/2020 1014   K 3.5 11/16/2020 1014   CL 103 11/16/2020 1014   CO2 26 11/16/2020 1014   GLUCOSE 197 (H) 11/16/2020 1014   BUN 10 11/16/2020 1014   CREATININE 0.80 11/16/2020 1014   CALCIUM 8.5 (L) 11/16/2020 1014   GFRNONAA >60 11/16/2020 1014   GFRAA >60 03/05/2017 1057    BNP No results found for: BNP  ProBNP No results found for: PROBNP  Imaging: DG Chest 2 View  Result Date: 11/22/2020 CLINICAL DATA:  Follow-up pneumonia EXAM: CHEST - 2 VIEW COMPARISON:  11/16/2020, CT chest 11/13/2020 FINDINGS: Improved aeration in the right lower lobe. Possible tiny right pleural effusion. Apical pulmonary scarring. Stable cardiomediastinal silhouette with aortic atherosclerosis. No pneumothorax. IMPRESSION: Improved aeration of right lower lobe with near complete clearing of previously noted right lower lobe pneumonia. Probable trace right pleural effusion Electronically Signed   By: 11/15/2020 M.D.   On: 11/22/2020 16:41   DG Chest 2 View  Result Date: 11/12/2020 CLINICAL DATA:  Fever for the past 2 days. EXAM: CHEST - 2 VIEW COMPARISON:  None. FINDINGS: There is a small  focus of airspace opacity in the right lung base worrisome for pneumonia. The left lung is clear. No pneumothorax or pleural effusion. Heart size is normal. Aortic atherosclerosis noted. IMPRESSION: Small focus right basilar airspace opacity worrisome for pneumonia. Aortic Atherosclerosis (ICD10-I70.0). Electronically Signed   By: 11/14/2020 M.D.   On: 11/12/2020 15:37   CT CHEST WO CONTRAST  Result Date: 11/13/2020 CLINICAL DATA:  Respiratory failure. EXAM: CT CHEST WITHOUT CONTRAST TECHNIQUE: Multidetector CT imaging of the chest was performed following the standard protocol without IV contrast. COMPARISON:  Chest radiograph November 12, 2020 FINDINGS: Cardiovascular: Normal heart size. Calcific atherosclerotic disease of the coronary arteries and aorta. Mild ectasia of the ascending aorta  measuring 3.8 cm. Tortuosity of the aorta. Mediastinum/Nodes: No enlarged mediastinal or axillary lymph nodes. Thyroid gland, trachea, and esophagus demonstrate no significant findings. Lungs/Pleura: Dense airspace consolidation in the right lower lobe. No evidence of pleural effusion or pneumothorax. Upper Abdomen: No acute abnormality. Musculoskeletal: No chest wall mass or suspicious bone lesions identified. Spondylosis of the spine. IMPRESSION: 1. Right lower lobe pneumonia. 2. Calcific atherosclerotic disease of the coronary arteries and aorta. 3. Mild ectasia of the ascending aorta measuring 3.8 cm. Aortic Atherosclerosis (ICD10-I70.0). Electronically Signed   By: Ted Mcalpine M.D.   On: 11/13/2020 16:51   DG CHEST PORT 1 VIEW  Result Date: 11/16/2020 CLINICAL DATA:  Fever, pneumonia EXAM: PORTABLE CHEST 1 VIEW COMPARISON:  11/12/2020 FINDINGS: Progressive infiltrate in the right lower lobe compatible with pneumonia. Progressive small right effusion Left lung remains clear.  Negative for heart failure. IMPRESSION: Progressive right lower lobe pneumonia. Progression of small right pleural effusion.  Electronically Signed   By: Marlan Palau M.D.   On: 11/16/2020 14:27     Assessment & Plan:   CAP (community acquired pneumonia) - Hospitalized for RLL pneumonia between 6/14-6/18. Treated with IV ceftriaxone/azithromycin, discharged on oral Augmentin. Clinically he continues to do well, no respiratory complaints. He completed Augmentin course. He remains afebrile. Lungs clear on exam. He has some minor residual fatigue/weakness, appetite is coming back. He lost 5 lbs. CXR today showed near complete resolution right lower lobe pneumonia. Recommend he take in 1-2 boost/ensure shakes a day to help with weight. No further follow-up warranted unless he develops new or worsening respiratory symptoms      Glenford Bayley, NP 11/22/2020

## 2020-11-22 NOTE — Progress Notes (Signed)
I called patient and LM. Please let him know CXR showed near complete resolution of pneumonia. He does not need any additional follow-up with our office if he continues to do well

## 2020-11-22 NOTE — Patient Instructions (Signed)
Recommendations: - We will check CXR today,  if pneumonia is still present will need to repeat in 4 weeks to ensure it has resolved  - Drink 1-2 boost or ensure shakes a day to help with weight gain (you can pick these up at pharmacy/grocery store) - You can resume your normal activities if feeling well in 5-10 days  Follow-up: - As needed if you develop any new or worsening respiratory symptoms

## 2020-11-25 NOTE — Progress Notes (Signed)
Reviewed and agree with assessment/plan.   Lottie Siska, MD [facility] 11/25/2020, 3:25 PM Pager:  [phone]  

## 2020-11-28 DIAGNOSIS — E7800 Pure hypercholesterolemia, unspecified: Secondary | ICD-10-CM | POA: Diagnosis not present

## 2020-11-28 DIAGNOSIS — I10 Essential (primary) hypertension: Secondary | ICD-10-CM | POA: Diagnosis not present

## 2020-11-28 DIAGNOSIS — E1169 Type 2 diabetes mellitus with other specified complication: Secondary | ICD-10-CM | POA: Diagnosis not present

## 2020-12-10 DIAGNOSIS — I10 Essential (primary) hypertension: Secondary | ICD-10-CM | POA: Diagnosis not present

## 2020-12-19 DIAGNOSIS — E7800 Pure hypercholesterolemia, unspecified: Secondary | ICD-10-CM | POA: Diagnosis not present

## 2020-12-19 DIAGNOSIS — E1169 Type 2 diabetes mellitus with other specified complication: Secondary | ICD-10-CM | POA: Diagnosis not present

## 2020-12-19 DIAGNOSIS — I10 Essential (primary) hypertension: Secondary | ICD-10-CM | POA: Diagnosis not present

## 2021-03-10 DIAGNOSIS — Z03818 Encounter for observation for suspected exposure to other biological agents ruled out: Secondary | ICD-10-CM | POA: Diagnosis not present

## 2021-03-10 DIAGNOSIS — E1169 Type 2 diabetes mellitus with other specified complication: Secondary | ICD-10-CM | POA: Diagnosis not present

## 2021-03-10 DIAGNOSIS — I10 Essential (primary) hypertension: Secondary | ICD-10-CM | POA: Diagnosis not present

## 2021-03-10 DIAGNOSIS — E7800 Pure hypercholesterolemia, unspecified: Secondary | ICD-10-CM | POA: Diagnosis not present

## 2021-03-10 DIAGNOSIS — R051 Acute cough: Secondary | ICD-10-CM | POA: Diagnosis not present

## 2021-03-18 DIAGNOSIS — U071 COVID-19: Secondary | ICD-10-CM | POA: Diagnosis not present

## 2021-03-20 DIAGNOSIS — U071 COVID-19: Secondary | ICD-10-CM | POA: Diagnosis not present

## 2021-04-04 DIAGNOSIS — E1169 Type 2 diabetes mellitus with other specified complication: Secondary | ICD-10-CM | POA: Diagnosis not present

## 2021-04-04 DIAGNOSIS — I10 Essential (primary) hypertension: Secondary | ICD-10-CM | POA: Diagnosis not present

## 2021-06-23 DIAGNOSIS — I10 Essential (primary) hypertension: Secondary | ICD-10-CM | POA: Diagnosis not present

## 2021-06-23 DIAGNOSIS — E7800 Pure hypercholesterolemia, unspecified: Secondary | ICD-10-CM | POA: Diagnosis not present

## 2021-06-23 DIAGNOSIS — E1169 Type 2 diabetes mellitus with other specified complication: Secondary | ICD-10-CM | POA: Diagnosis not present

## 2021-08-25 DIAGNOSIS — Z961 Presence of intraocular lens: Secondary | ICD-10-CM | POA: Diagnosis not present

## 2021-08-25 DIAGNOSIS — H5340 Unspecified visual field defects: Secondary | ICD-10-CM | POA: Diagnosis not present

## 2021-09-19 DIAGNOSIS — H5340 Unspecified visual field defects: Secondary | ICD-10-CM | POA: Diagnosis not present

## 2021-09-19 DIAGNOSIS — H02403 Unspecified ptosis of bilateral eyelids: Secondary | ICD-10-CM | POA: Diagnosis not present

## 2021-10-01 DIAGNOSIS — Z79899 Other long term (current) drug therapy: Secondary | ICD-10-CM | POA: Diagnosis not present

## 2021-10-01 DIAGNOSIS — M19042 Primary osteoarthritis, left hand: Secondary | ICD-10-CM | POA: Diagnosis not present

## 2021-10-01 DIAGNOSIS — I700 Atherosclerosis of aorta: Secondary | ICD-10-CM | POA: Diagnosis not present

## 2021-10-01 DIAGNOSIS — Z0000 Encounter for general adult medical examination without abnormal findings: Secondary | ICD-10-CM | POA: Diagnosis not present

## 2021-10-01 DIAGNOSIS — Z1331 Encounter for screening for depression: Secondary | ICD-10-CM | POA: Diagnosis not present

## 2021-10-01 DIAGNOSIS — E1169 Type 2 diabetes mellitus with other specified complication: Secondary | ICD-10-CM | POA: Diagnosis not present

## 2021-10-01 DIAGNOSIS — M19041 Primary osteoarthritis, right hand: Secondary | ICD-10-CM | POA: Diagnosis not present

## 2021-10-01 DIAGNOSIS — E7800 Pure hypercholesterolemia, unspecified: Secondary | ICD-10-CM | POA: Diagnosis not present

## 2021-10-01 DIAGNOSIS — I10 Essential (primary) hypertension: Secondary | ICD-10-CM | POA: Diagnosis not present

## 2021-10-22 DIAGNOSIS — S0101XA Laceration without foreign body of scalp, initial encounter: Secondary | ICD-10-CM | POA: Diagnosis not present

## 2021-10-22 DIAGNOSIS — W19XXXA Unspecified fall, initial encounter: Secondary | ICD-10-CM | POA: Diagnosis not present

## 2021-10-22 DIAGNOSIS — S161XXA Strain of muscle, fascia and tendon at neck level, initial encounter: Secondary | ICD-10-CM | POA: Diagnosis not present

## 2021-12-22 IMAGING — CR DG CHEST 2V
2 series · 2 of 2 positions shown · non-contrast
Comparison: None.

CLINICAL DATA: Fever for the past 2 days.

EXAM:
CHEST - 2 VIEW

[w chest lat]
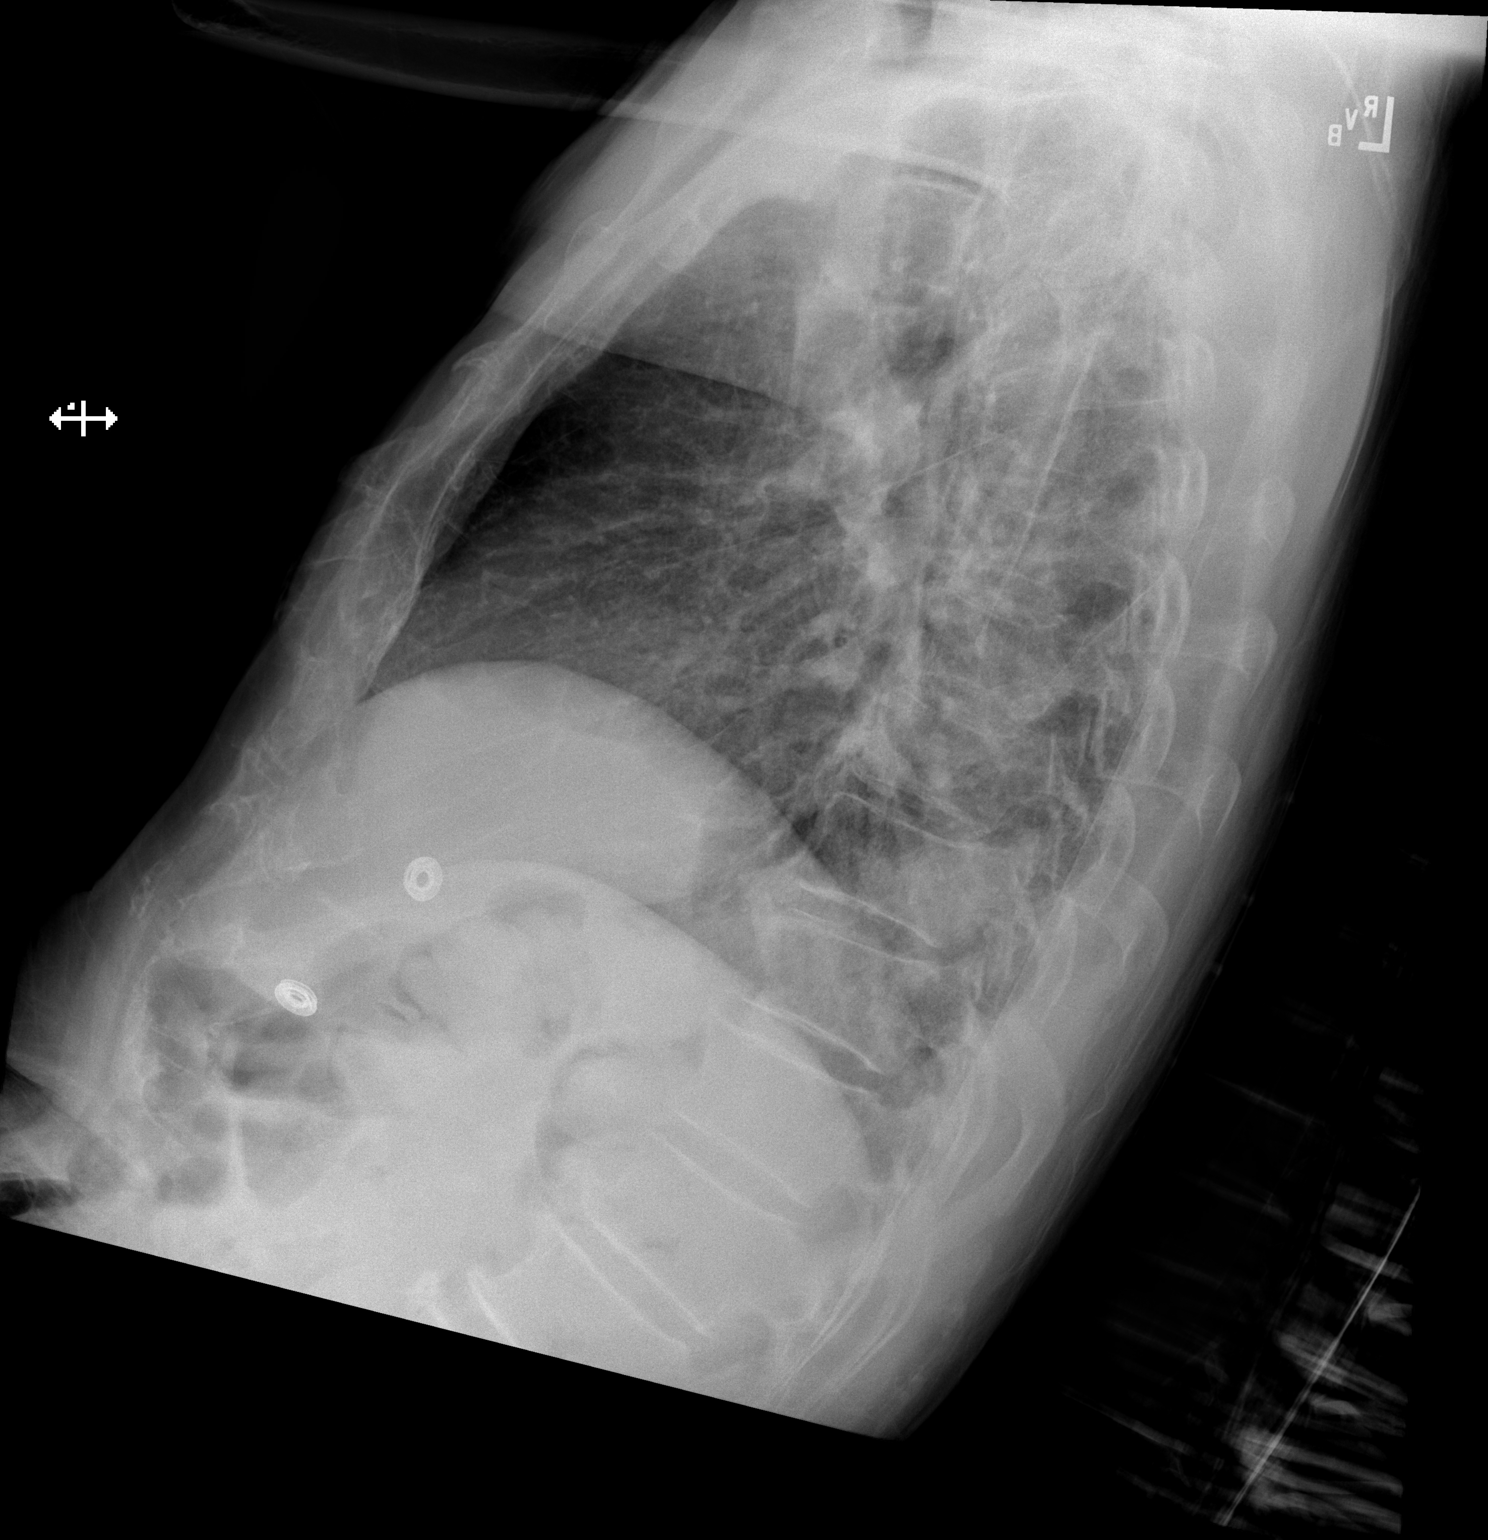

[x chest ap]
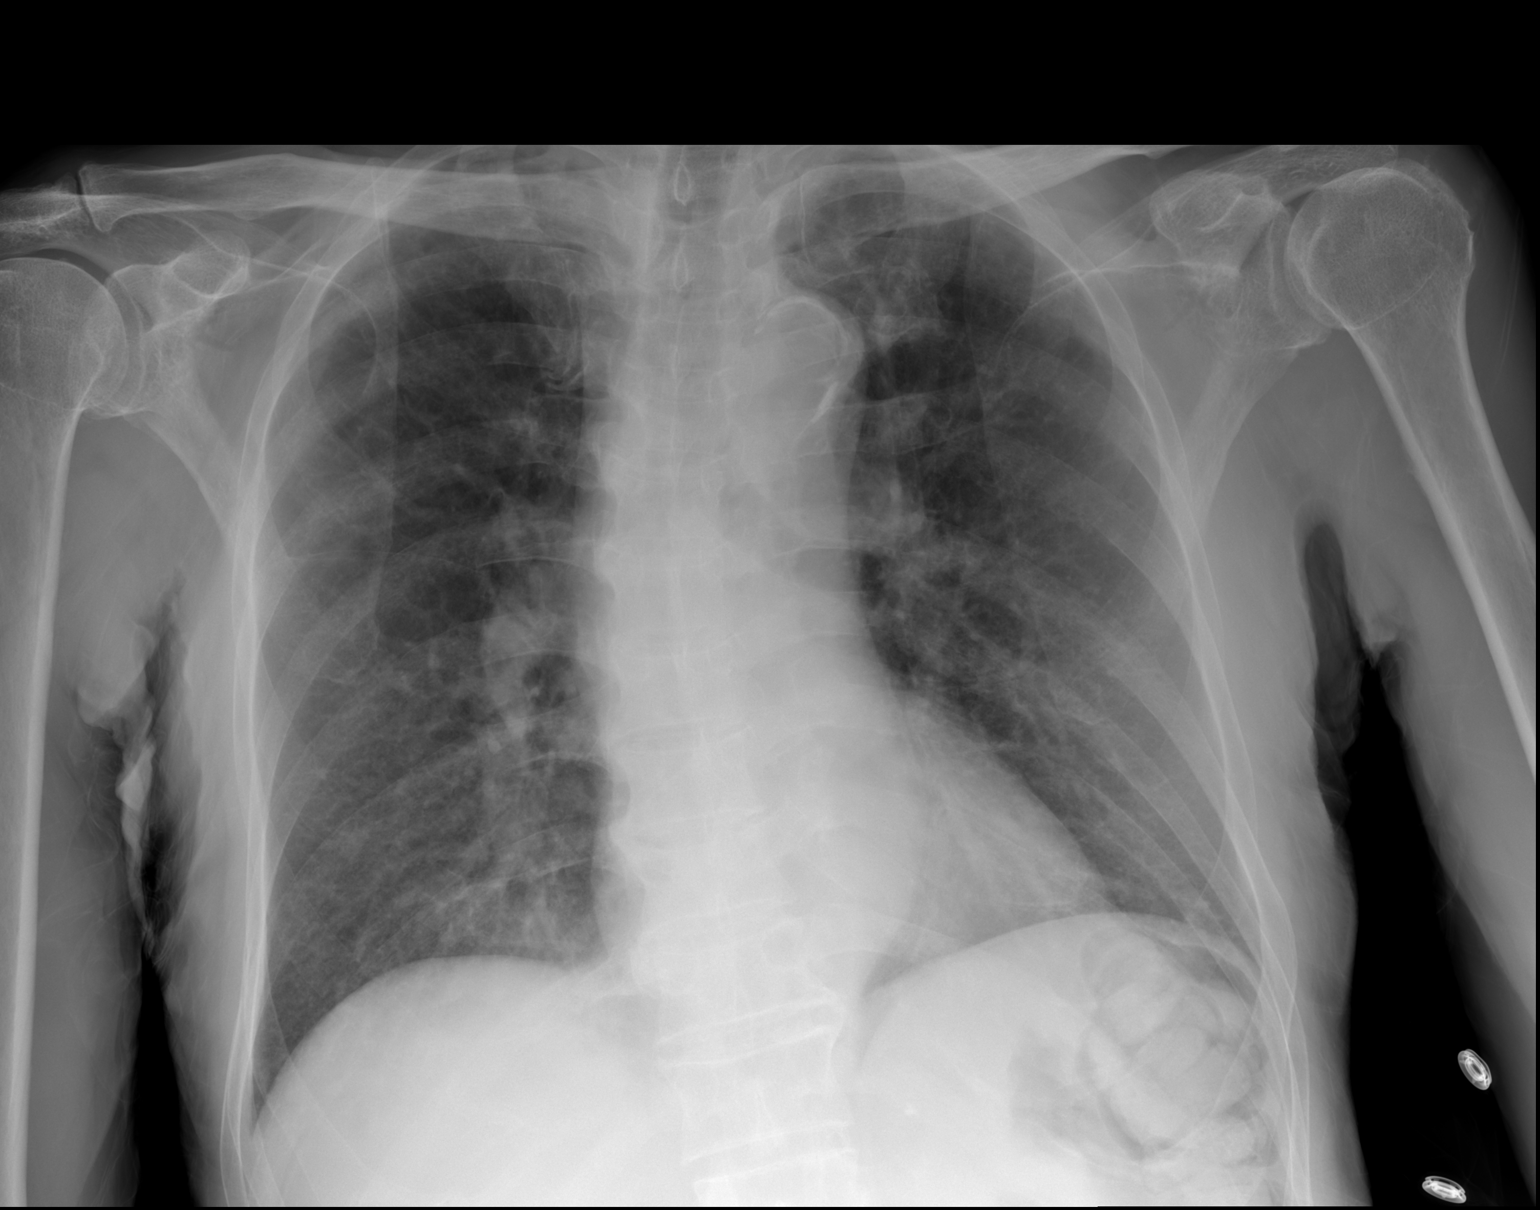

[2 of 2 positions shown; findings below may reference images not displayed]

FINDINGS: There is a small focus of airspace opacity in the right lung base
worrisome for pneumonia. The left lung is clear. No pneumothorax or
pleural effusion. Heart size is normal. Aortic atherosclerosis
noted.
IMPRESSION: Small focus right basilar airspace opacity worrisome for pneumonia.

Aortic Atherosclerosis ([id number]).

## 2021-12-23 IMAGING — CT CT CHEST W/O CM
2 of 4 series · 15 of 36 positions shown, 18 images · non-contrast
Comparison: Chest radiograph November 12, 2020

CLINICAL DATA: Respiratory failure.

EXAM:
CT CHEST WITHOUT CONTRAST
TECHNIQUE: Multidetector CT imaging of the chest was performed following the
standard protocol without IV contrast.

[Series 2: thorax · axial · 0.74mm/px · z∈[-288,-18]mm · 12 of 161 slices shown, 15 images]
[im 13/161  mediastinal]
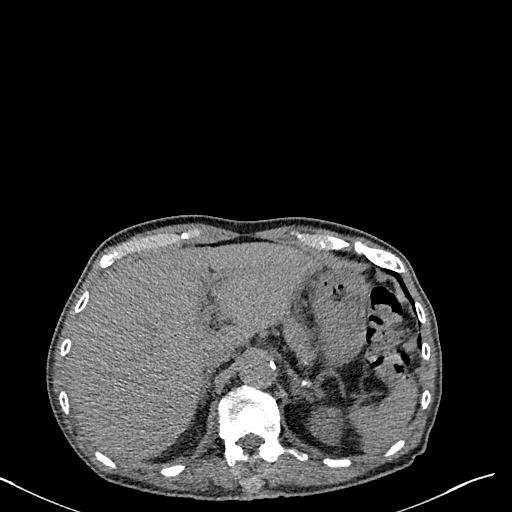
[im 13/161  lung]
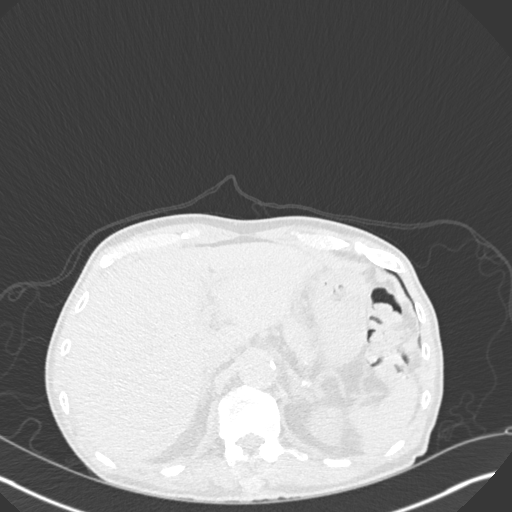
[im 25/161  lung]
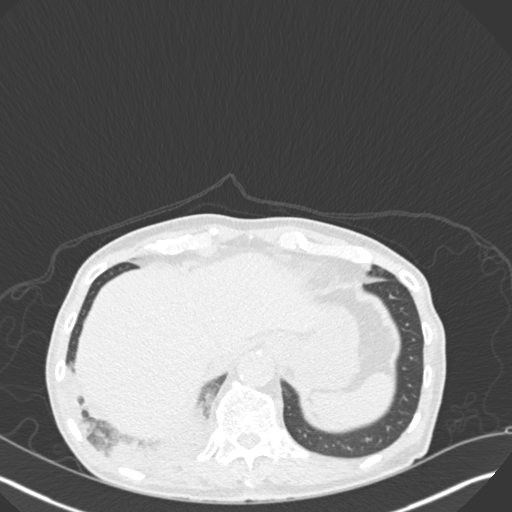
[im 37/161  lung]
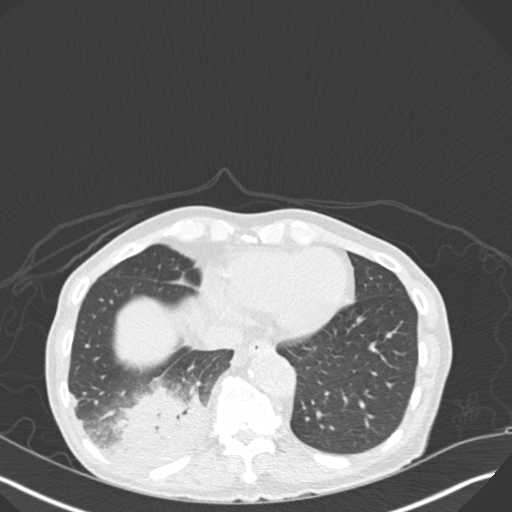
[im 50/161  lung]
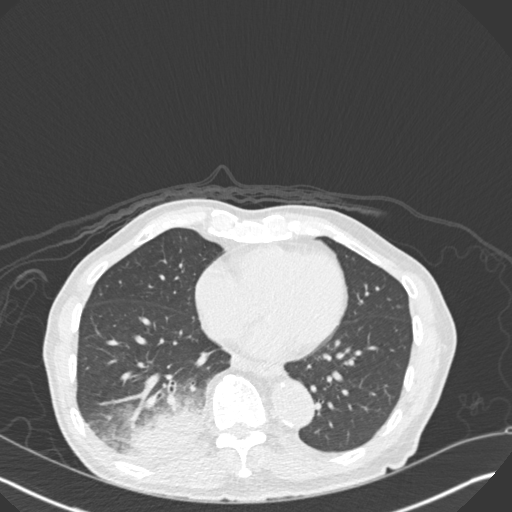
[im 62/161  mediastinal]
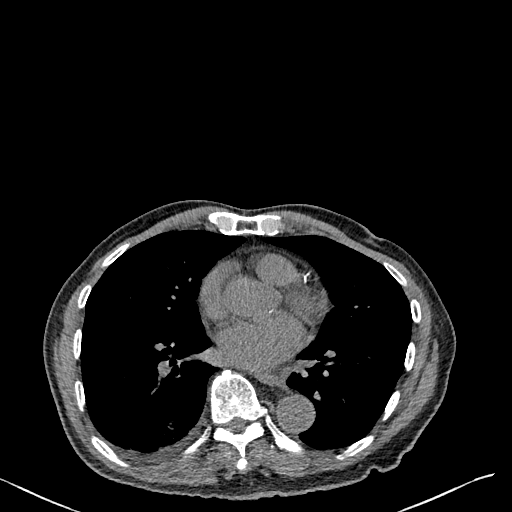
[im 62/161  lung]
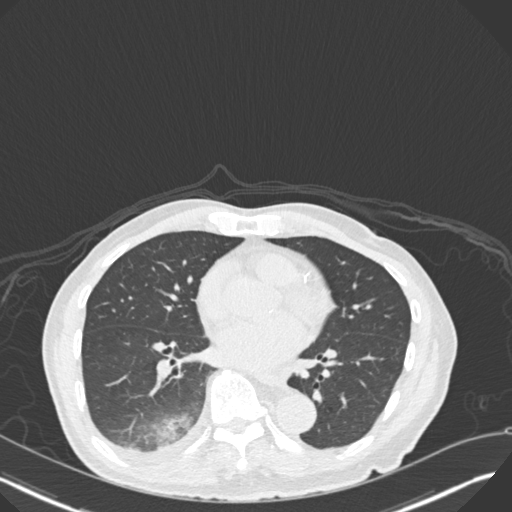
[im 74/161  lung]
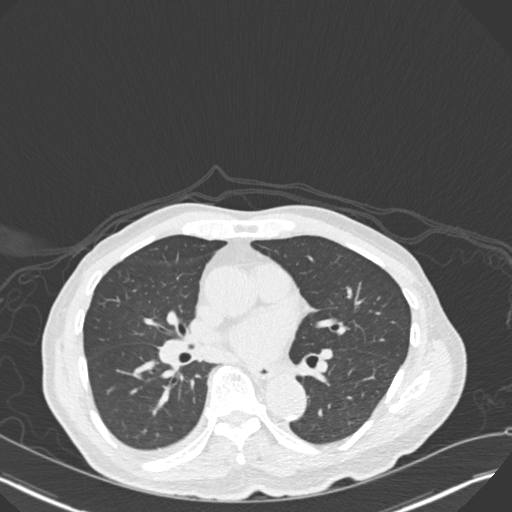
[im 87/161  lung]
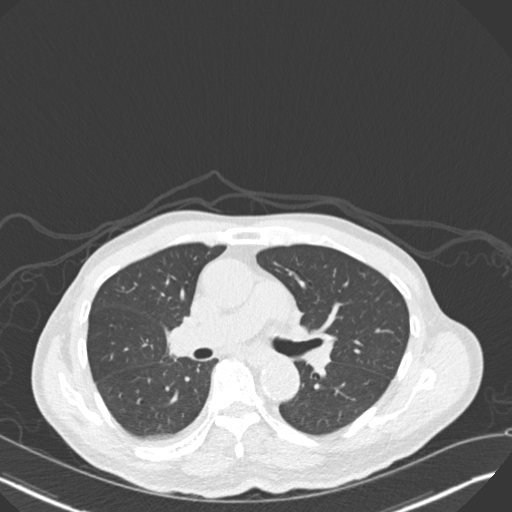
[im 99/161  lung]
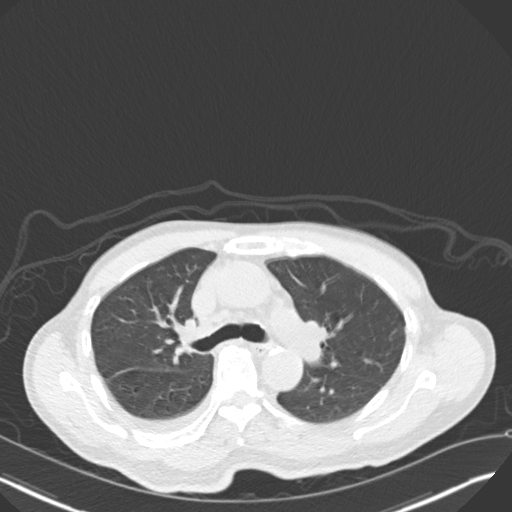
[im 111/161  mediastinal]
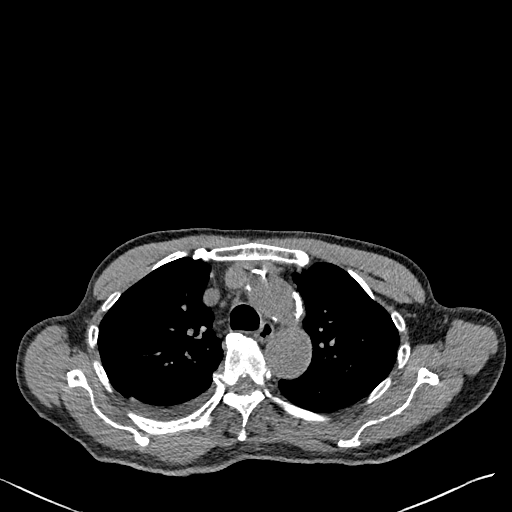
[im 111/161  lung]
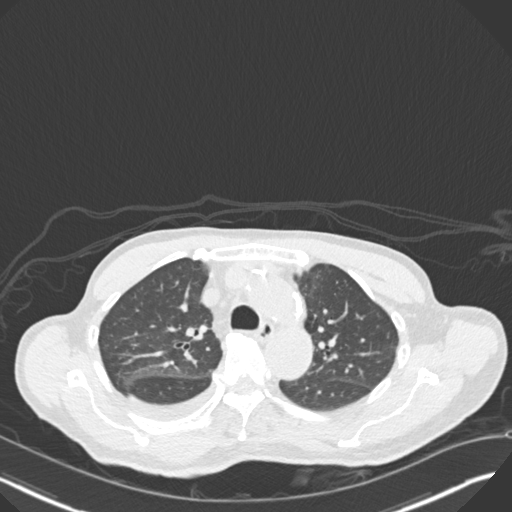
[im 124/161  lung]
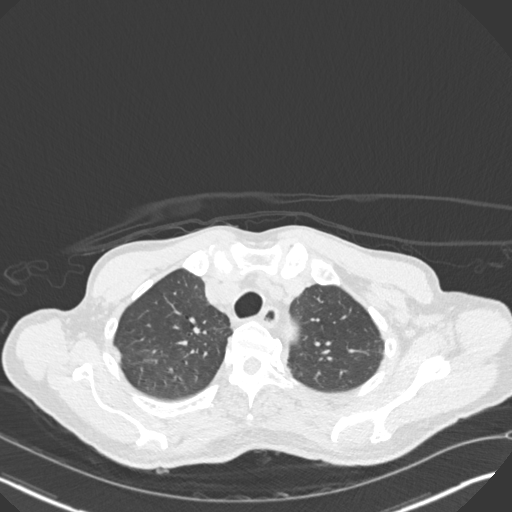
[im 136/161  lung]
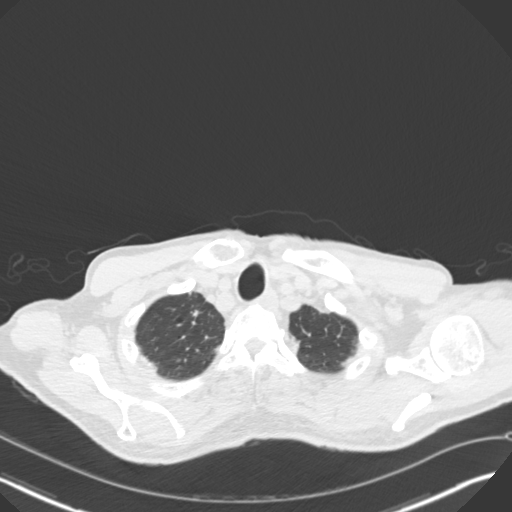
[im 148/161  lung]
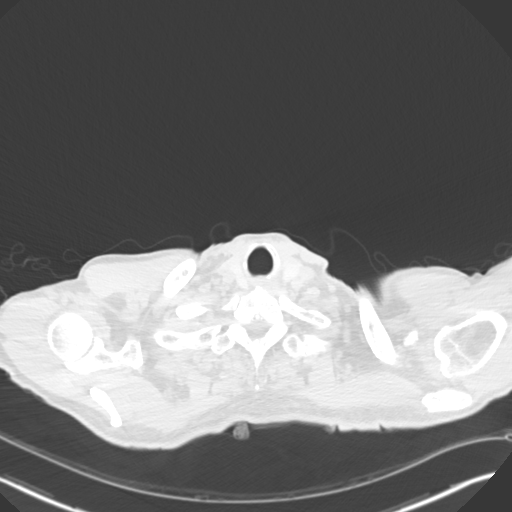

[Series 5: coronal · coronal · 0.63mm/px · 3 of 117 slices shown]
[im 24/117  lung]
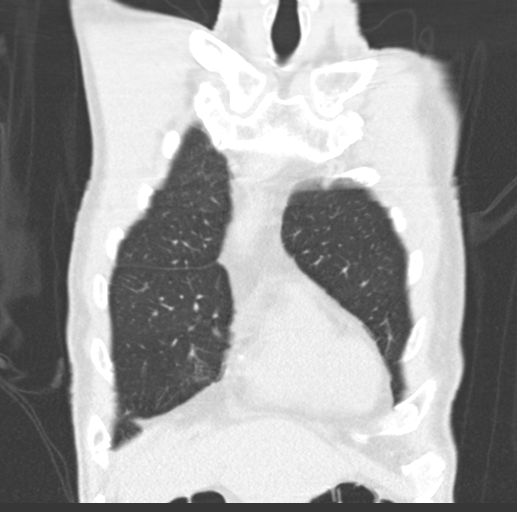
[im 47/117  lung]
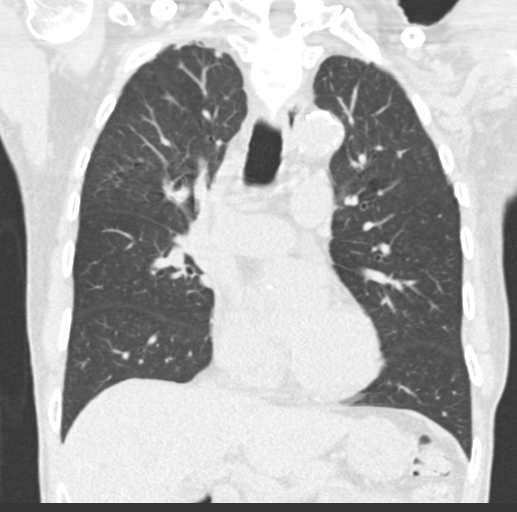
[im 70/117  lung]
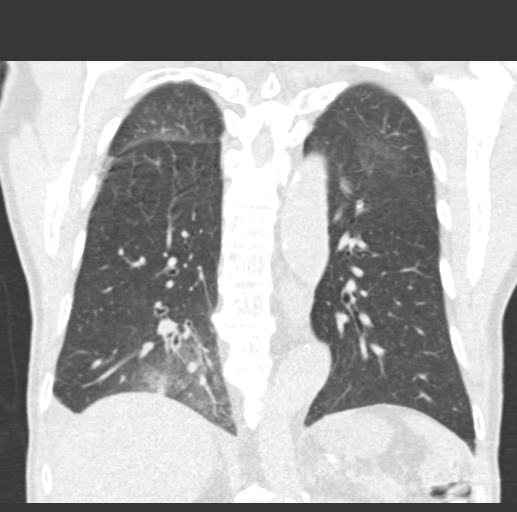

[15 of 36 positions shown; findings below may reference images not displayed]

FINDINGS: Cardiovascular: Normal heart size. Calcific atherosclerotic disease
of the coronary arteries and aorta. Mild ectasia of the ascending
aorta measuring 3.8 cm. Tortuosity of the aorta.

Mediastinum/Nodes: No enlarged mediastinal or axillary lymph nodes.
Thyroid gland, trachea, and esophagus demonstrate no significant
findings.

Lungs/Pleura: Dense airspace consolidation in the right lower lobe.
No evidence of pleural effusion or pneumothorax.

Upper Abdomen: No acute abnormality.

Musculoskeletal: No chest wall mass or suspicious bone lesions
identified. Spondylosis of the spine.
IMPRESSION: 1. Right lower lobe pneumonia.
2. Calcific atherosclerotic disease of the coronary arteries and
aorta.
3. Mild ectasia of the ascending aorta measuring 3.8 cm.

Aortic Atherosclerosis ([id number]).

## 2021-12-26 IMAGING — DX DG CHEST 1V PORT
1 series · 1 of 1 positions shown · non-contrast
Comparison: 11/12/2020

CLINICAL DATA: Fever, pneumonia

EXAM:
PORTABLE CHEST 1 VIEW

[chest ap]
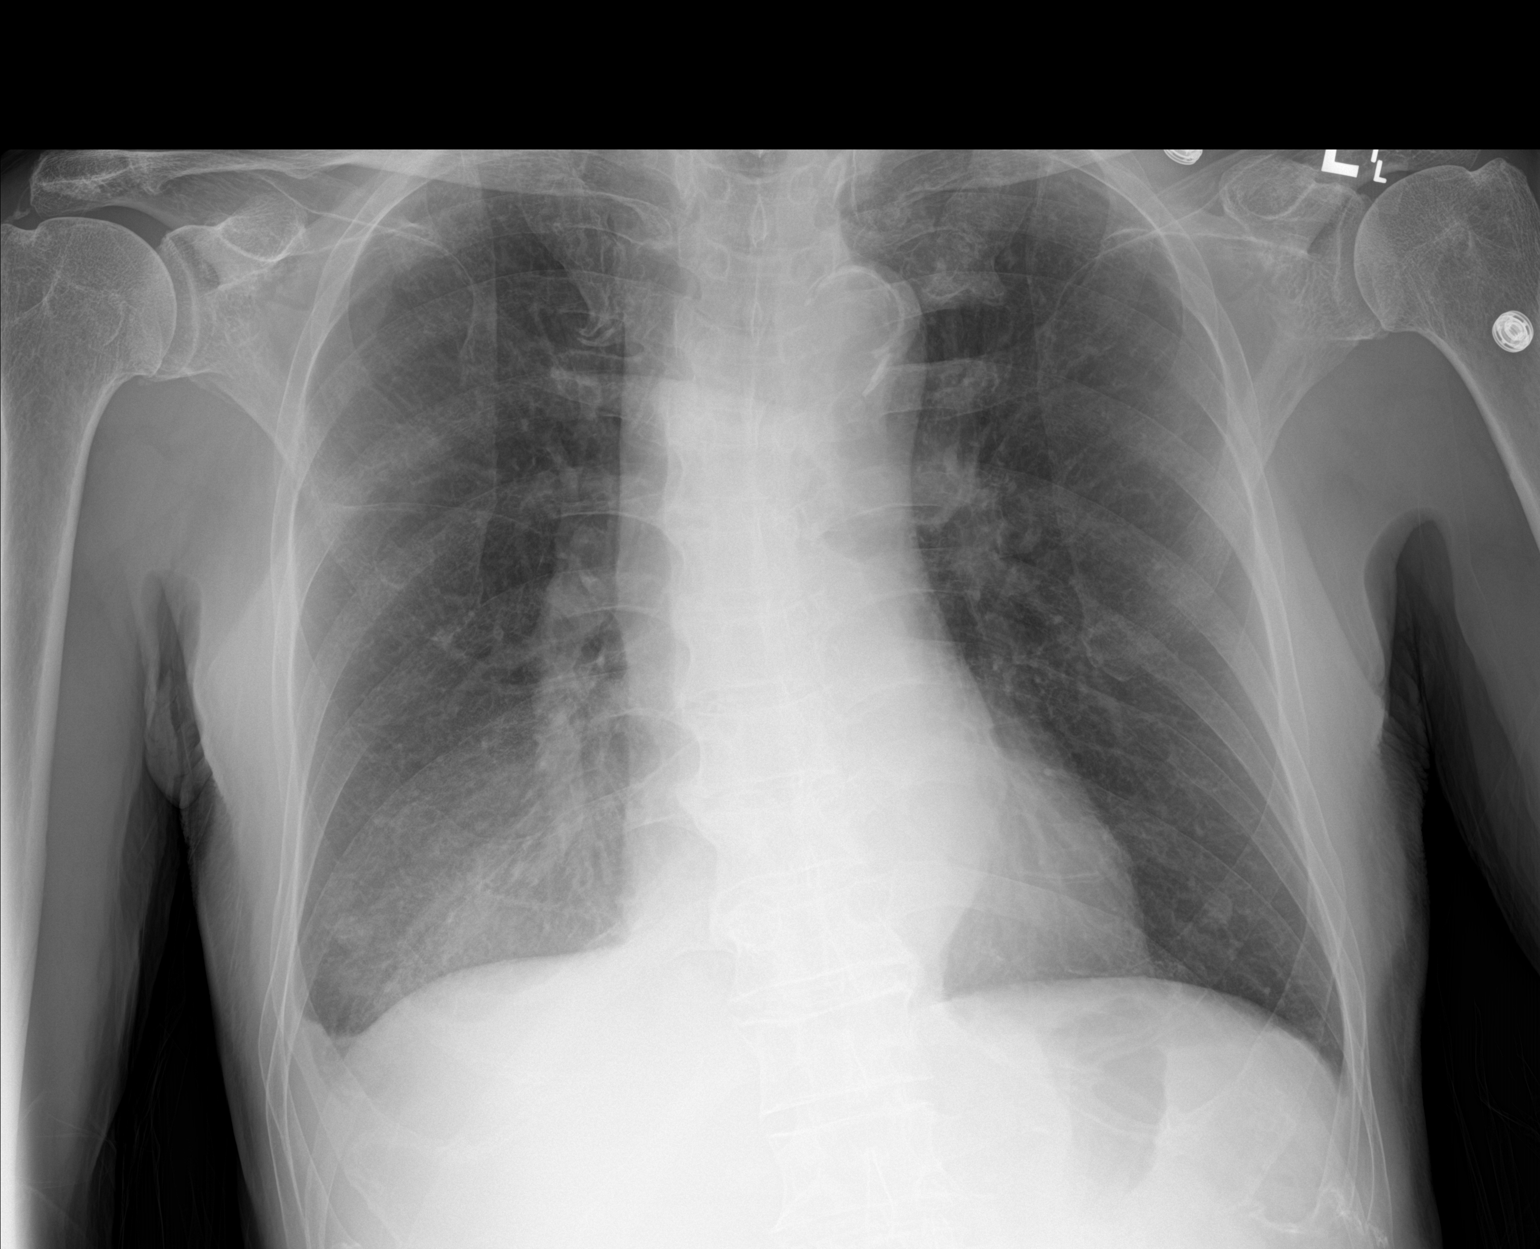

[1 of 1 positions shown; findings below may reference images not displayed]

FINDINGS: Progressive infiltrate in the right lower lobe compatible with
pneumonia. Progressive small right effusion

Left lung remains clear.  Negative for heart failure.
IMPRESSION: Progressive right lower lobe pneumonia. Progression of small right
pleural effusion.

## 2022-04-07 DIAGNOSIS — E1169 Type 2 diabetes mellitus with other specified complication: Secondary | ICD-10-CM | POA: Diagnosis not present

## 2022-04-07 DIAGNOSIS — Z23 Encounter for immunization: Secondary | ICD-10-CM | POA: Diagnosis not present

## 2022-04-07 DIAGNOSIS — I10 Essential (primary) hypertension: Secondary | ICD-10-CM | POA: Diagnosis not present

## 2022-04-13 DIAGNOSIS — R509 Fever, unspecified: Secondary | ICD-10-CM | POA: Diagnosis not present

## 2022-04-13 DIAGNOSIS — R197 Diarrhea, unspecified: Secondary | ICD-10-CM | POA: Diagnosis not present

## 2022-04-13 DIAGNOSIS — Z03818 Encounter for observation for suspected exposure to other biological agents ruled out: Secondary | ICD-10-CM | POA: Diagnosis not present

## 2022-05-15 DIAGNOSIS — R509 Fever, unspecified: Secondary | ICD-10-CM | POA: Diagnosis not present

## 2022-05-15 DIAGNOSIS — Z03818 Encounter for observation for suspected exposure to other biological agents ruled out: Secondary | ICD-10-CM | POA: Diagnosis not present

## 2022-06-24 DIAGNOSIS — E1169 Type 2 diabetes mellitus with other specified complication: Secondary | ICD-10-CM | POA: Diagnosis not present

## 2022-06-24 DIAGNOSIS — E7800 Pure hypercholesterolemia, unspecified: Secondary | ICD-10-CM | POA: Diagnosis not present

## 2022-06-24 DIAGNOSIS — I10 Essential (primary) hypertension: Secondary | ICD-10-CM | POA: Diagnosis not present

## 2022-06-24 DIAGNOSIS — I700 Atherosclerosis of aorta: Secondary | ICD-10-CM | POA: Diagnosis not present

## 2022-07-27 DIAGNOSIS — E1169 Type 2 diabetes mellitus with other specified complication: Secondary | ICD-10-CM | POA: Diagnosis not present

## 2022-09-24 DIAGNOSIS — H1789 Other corneal scars and opacities: Secondary | ICD-10-CM | POA: Diagnosis not present

## 2022-09-24 DIAGNOSIS — Z961 Presence of intraocular lens: Secondary | ICD-10-CM | POA: Diagnosis not present

## 2022-09-24 DIAGNOSIS — H02403 Unspecified ptosis of bilateral eyelids: Secondary | ICD-10-CM | POA: Diagnosis not present

## 2022-09-24 DIAGNOSIS — H524 Presbyopia: Secondary | ICD-10-CM | POA: Diagnosis not present

## 2022-10-02 DIAGNOSIS — Z0100 Encounter for examination of eyes and vision without abnormal findings: Secondary | ICD-10-CM | POA: Diagnosis not present

## 2022-12-01 DIAGNOSIS — Z79899 Other long term (current) drug therapy: Secondary | ICD-10-CM | POA: Diagnosis not present

## 2022-12-01 DIAGNOSIS — Z1331 Encounter for screening for depression: Secondary | ICD-10-CM | POA: Diagnosis not present

## 2022-12-01 DIAGNOSIS — E119 Type 2 diabetes mellitus without complications: Secondary | ICD-10-CM | POA: Diagnosis not present

## 2022-12-01 DIAGNOSIS — I700 Atherosclerosis of aorta: Secondary | ICD-10-CM | POA: Diagnosis not present

## 2022-12-01 DIAGNOSIS — Z23 Encounter for immunization: Secondary | ICD-10-CM | POA: Diagnosis not present

## 2022-12-01 DIAGNOSIS — E7800 Pure hypercholesterolemia, unspecified: Secondary | ICD-10-CM | POA: Diagnosis not present

## 2022-12-01 DIAGNOSIS — I77810 Thoracic aortic ectasia: Secondary | ICD-10-CM | POA: Diagnosis not present

## 2022-12-01 DIAGNOSIS — E1169 Type 2 diabetes mellitus with other specified complication: Secondary | ICD-10-CM | POA: Diagnosis not present

## 2022-12-01 DIAGNOSIS — I10 Essential (primary) hypertension: Secondary | ICD-10-CM | POA: Diagnosis not present

## 2022-12-01 DIAGNOSIS — Z0000 Encounter for general adult medical examination without abnormal findings: Secondary | ICD-10-CM | POA: Diagnosis not present

## 2023-05-03 DIAGNOSIS — H02412 Mechanical ptosis of left eyelid: Secondary | ICD-10-CM | POA: Diagnosis not present

## 2023-05-03 DIAGNOSIS — H02423 Myogenic ptosis of bilateral eyelids: Secondary | ICD-10-CM | POA: Diagnosis not present

## 2023-05-03 DIAGNOSIS — Z01818 Encounter for other preprocedural examination: Secondary | ICD-10-CM | POA: Diagnosis not present

## 2023-05-03 DIAGNOSIS — H02831 Dermatochalasis of right upper eyelid: Secondary | ICD-10-CM | POA: Diagnosis not present

## 2023-05-03 DIAGNOSIS — H02422 Myogenic ptosis of left eyelid: Secondary | ICD-10-CM | POA: Diagnosis not present

## 2023-05-03 DIAGNOSIS — H02421 Myogenic ptosis of right eyelid: Secondary | ICD-10-CM | POA: Diagnosis not present

## 2023-05-03 DIAGNOSIS — H57813 Brow ptosis, bilateral: Secondary | ICD-10-CM | POA: Diagnosis not present

## 2023-05-03 DIAGNOSIS — H02413 Mechanical ptosis of bilateral eyelids: Secondary | ICD-10-CM | POA: Diagnosis not present

## 2023-05-03 DIAGNOSIS — H02411 Mechanical ptosis of right eyelid: Secondary | ICD-10-CM | POA: Diagnosis not present

## 2023-05-03 DIAGNOSIS — H53483 Generalized contraction of visual field, bilateral: Secondary | ICD-10-CM | POA: Diagnosis not present

## 2023-05-03 DIAGNOSIS — H02834 Dermatochalasis of left upper eyelid: Secondary | ICD-10-CM | POA: Diagnosis not present

## 2023-05-03 DIAGNOSIS — H0279 Other degenerative disorders of eyelid and periocular area: Secondary | ICD-10-CM | POA: Diagnosis not present

## 2023-05-10 DIAGNOSIS — H53483 Generalized contraction of visual field, bilateral: Secondary | ICD-10-CM | POA: Diagnosis not present

## 2023-06-14 DIAGNOSIS — H02834 Dermatochalasis of left upper eyelid: Secondary | ICD-10-CM | POA: Diagnosis not present

## 2023-06-14 DIAGNOSIS — H02412 Mechanical ptosis of left eyelid: Secondary | ICD-10-CM | POA: Diagnosis not present

## 2023-06-14 DIAGNOSIS — H0279 Other degenerative disorders of eyelid and periocular area: Secondary | ICD-10-CM | POA: Diagnosis not present

## 2023-06-14 DIAGNOSIS — H57813 Brow ptosis, bilateral: Secondary | ICD-10-CM | POA: Diagnosis not present

## 2023-06-14 DIAGNOSIS — H02423 Myogenic ptosis of bilateral eyelids: Secondary | ICD-10-CM | POA: Diagnosis not present

## 2023-06-14 DIAGNOSIS — H02413 Mechanical ptosis of bilateral eyelids: Secondary | ICD-10-CM | POA: Diagnosis not present

## 2023-06-14 DIAGNOSIS — H02411 Mechanical ptosis of right eyelid: Secondary | ICD-10-CM | POA: Diagnosis not present

## 2023-06-14 DIAGNOSIS — H53483 Generalized contraction of visual field, bilateral: Secondary | ICD-10-CM | POA: Diagnosis not present

## 2023-06-14 DIAGNOSIS — H02422 Myogenic ptosis of left eyelid: Secondary | ICD-10-CM | POA: Diagnosis not present

## 2023-06-14 DIAGNOSIS — H02421 Myogenic ptosis of right eyelid: Secondary | ICD-10-CM | POA: Diagnosis not present

## 2023-06-14 DIAGNOSIS — H02831 Dermatochalasis of right upper eyelid: Secondary | ICD-10-CM | POA: Diagnosis not present

## 2023-09-27 DIAGNOSIS — E119 Type 2 diabetes mellitus without complications: Secondary | ICD-10-CM | POA: Diagnosis not present

## 2023-09-27 DIAGNOSIS — H524 Presbyopia: Secondary | ICD-10-CM | POA: Diagnosis not present

## 2023-09-27 DIAGNOSIS — Z961 Presence of intraocular lens: Secondary | ICD-10-CM | POA: Diagnosis not present

## 2023-11-29 DIAGNOSIS — E1159 Type 2 diabetes mellitus with other circulatory complications: Secondary | ICD-10-CM | POA: Diagnosis not present

## 2023-11-29 DIAGNOSIS — E7800 Pure hypercholesterolemia, unspecified: Secondary | ICD-10-CM | POA: Diagnosis not present

## 2023-11-29 DIAGNOSIS — I10 Essential (primary) hypertension: Secondary | ICD-10-CM | POA: Diagnosis not present

## 2023-11-29 DIAGNOSIS — E1169 Type 2 diabetes mellitus with other specified complication: Secondary | ICD-10-CM | POA: Diagnosis not present

## 2023-12-08 DIAGNOSIS — R1013 Epigastric pain: Secondary | ICD-10-CM | POA: Diagnosis not present

## 2023-12-21 DIAGNOSIS — Z79899 Other long term (current) drug therapy: Secondary | ICD-10-CM | POA: Diagnosis not present

## 2023-12-21 DIAGNOSIS — E1159 Type 2 diabetes mellitus with other circulatory complications: Secondary | ICD-10-CM | POA: Diagnosis not present

## 2023-12-21 DIAGNOSIS — I10 Essential (primary) hypertension: Secondary | ICD-10-CM | POA: Diagnosis not present

## 2023-12-21 DIAGNOSIS — Z1331 Encounter for screening for depression: Secondary | ICD-10-CM | POA: Diagnosis not present

## 2023-12-21 DIAGNOSIS — E1169 Type 2 diabetes mellitus with other specified complication: Secondary | ICD-10-CM | POA: Diagnosis not present

## 2023-12-21 DIAGNOSIS — R413 Other amnesia: Secondary | ICD-10-CM | POA: Diagnosis not present

## 2023-12-21 DIAGNOSIS — R1013 Epigastric pain: Secondary | ICD-10-CM | POA: Diagnosis not present

## 2023-12-21 DIAGNOSIS — E7800 Pure hypercholesterolemia, unspecified: Secondary | ICD-10-CM | POA: Diagnosis not present

## 2023-12-21 DIAGNOSIS — Z0000 Encounter for general adult medical examination without abnormal findings: Secondary | ICD-10-CM | POA: Diagnosis not present

## 2023-12-30 DIAGNOSIS — I10 Essential (primary) hypertension: Secondary | ICD-10-CM | POA: Diagnosis not present

## 2023-12-30 DIAGNOSIS — E1169 Type 2 diabetes mellitus with other specified complication: Secondary | ICD-10-CM | POA: Diagnosis not present

## 2023-12-30 DIAGNOSIS — E7800 Pure hypercholesterolemia, unspecified: Secondary | ICD-10-CM | POA: Diagnosis not present

## 2023-12-30 DIAGNOSIS — E1159 Type 2 diabetes mellitus with other circulatory complications: Secondary | ICD-10-CM | POA: Diagnosis not present

## 2024-05-29 DIAGNOSIS — R1013 Epigastric pain: Secondary | ICD-10-CM | POA: Diagnosis not present
